# Patient Record
Sex: Male | Born: 1995 | Race: White | Hispanic: No | Marital: Single | Smoking: Current some day smoker
Health system: Southern US, Community
[De-identification: ages and names within clinical notes are randomized; demographics above are authoritative.]

## PROBLEM LIST (undated history)

## (undated) DIAGNOSIS — G4700 Insomnia, unspecified: Secondary | ICD-10-CM

## (undated) DIAGNOSIS — R110 Nausea: Secondary | ICD-10-CM

## (undated) DIAGNOSIS — F419 Anxiety disorder, unspecified: Secondary | ICD-10-CM

---
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
## 2015-11-16 ENCOUNTER — Emergency Department (HOSPITAL_COMMUNITY): Admission: EM | Admit: 2015-11-16 | Discharge: 2015-11-16 | Payer: Self-pay

## 2015-11-16 ENCOUNTER — Encounter (HOSPITAL_COMMUNITY): Payer: Self-pay | Admitting: Emergency Medicine

## 2015-11-16 ENCOUNTER — Inpatient Hospital Stay (HOSPITAL_COMMUNITY)
Admission: AD | Admit: 2015-11-16 | Discharge: 2015-11-19 | DRG: 881 | Disposition: A | Discharge status: Home / Self Care | Payer: Federal, State, Local not specified - Other | Source: Intra-hospital | Attending: Psychiatry | Admitting: Psychiatry

## 2015-11-16 ENCOUNTER — Encounter (HOSPITAL_COMMUNITY): Payer: Self-pay

## 2015-11-16 DIAGNOSIS — Y929 Unspecified place or not applicable: Secondary | ICD-10-CM | POA: Insufficient documentation

## 2015-11-16 DIAGNOSIS — F17210 Nicotine dependence, cigarettes, uncomplicated: Secondary | ICD-10-CM | POA: Insufficient documentation

## 2015-11-16 DIAGNOSIS — Z5181 Encounter for therapeutic drug level monitoring: Secondary | ICD-10-CM | POA: Insufficient documentation

## 2015-11-16 DIAGNOSIS — Z7289 Other problems related to lifestyle: Secondary | ICD-10-CM

## 2015-11-16 DIAGNOSIS — Z720 Tobacco use: Secondary | ICD-10-CM

## 2015-11-16 DIAGNOSIS — S50812A Abrasion of left forearm, initial encounter: Secondary | ICD-10-CM | POA: Insufficient documentation

## 2015-11-16 DIAGNOSIS — G4700 Insomnia, unspecified: Secondary | ICD-10-CM | POA: Diagnosis present

## 2015-11-16 DIAGNOSIS — Y999 Unspecified external cause status: Secondary | ICD-10-CM | POA: Insufficient documentation

## 2015-11-16 DIAGNOSIS — F411 Generalized anxiety disorder: Secondary | ICD-10-CM | POA: Diagnosis present

## 2015-11-16 DIAGNOSIS — R45851 Suicidal ideations: Secondary | ICD-10-CM

## 2015-11-16 DIAGNOSIS — F322 Major depressive disorder, single episode, severe without psychotic features: Secondary | ICD-10-CM | POA: Diagnosis not present

## 2015-11-16 DIAGNOSIS — S40812A Abrasion of left upper arm, initial encounter: Secondary | ICD-10-CM

## 2015-11-16 DIAGNOSIS — X781XXA Intentional self-harm by knife, initial encounter: Secondary | ICD-10-CM | POA: Insufficient documentation

## 2015-11-16 DIAGNOSIS — F329 Major depressive disorder, single episode, unspecified: Secondary | ICD-10-CM | POA: Diagnosis present

## 2015-11-16 DIAGNOSIS — F1290 Cannabis use, unspecified, uncomplicated: Secondary | ICD-10-CM | POA: Insufficient documentation

## 2015-11-16 DIAGNOSIS — Y939 Activity, unspecified: Secondary | ICD-10-CM | POA: Insufficient documentation

## 2015-11-16 LAB — SALICYLATE LEVEL: Salicylate Lvl: <4 mg/dL (ref 2.8–30.0)

## 2015-11-16 LAB — COMPREHENSIVE METABOLIC PANEL
ALK PHOS: 44 U/L (ref 38–126)
ALT: 14 U/L — ABNORMAL LOW (ref 17–63)
AST: 20 U/L (ref 15–41)
Albumin: 4.8 g/dL (ref 3.5–5.0)
Anion gap: 5 (ref 5–15)
BILIRUBIN TOTAL: 0.7 mg/dL (ref 0.3–1.2)
BUN: 14 mg/dL (ref 6–20)
CALCIUM: 9.3 mg/dL (ref 8.9–10.3)
CO2: 28 mmol/L (ref 22–32)
CREATININE: 0.95 mg/dL (ref 0.61–1.24)
Chloride: 106 mmol/L (ref 101–111)
GFR calc Af Amer: >60 mL/min (ref >60)
Glucose, Bld: 99 mg/dL (ref 65–99)
Potassium: 4.4 mmol/L (ref 3.5–5.1)
SODIUM: 139 mmol/L (ref 135–145)
TOTAL PROTEIN: 7.2 g/dL (ref 6.5–8.1)

## 2015-11-16 LAB — CBC
HCT: 44.8 % (ref 39.0–52.0)
Hemoglobin: 15.9 g/dL (ref 13.0–17.0)
MCH: 30.9 pg (ref 26.0–34.0)
MCHC: 35.5 g/dL (ref 30.0–36.0)
MCV: 87.2 fL (ref 78.0–100.0)
PLATELETS: 194 10*3/uL (ref 150–400)
RBC: 5.14 MIL/uL (ref 4.22–5.81)
RDW: 12.3 % (ref 11.5–15.5)
WBC: 6.2 10*3/uL (ref 4.0–10.5)

## 2015-11-16 LAB — RAPID URINE DRUG SCREEN, HOSP PERFORMED
AMPHETAMINES: NOT DETECTED
Barbiturates: NOT DETECTED
Benzodiazepines: NOT DETECTED
Cocaine: NOT DETECTED
Opiates: NOT DETECTED
Tetrahydrocannabinol: POSITIVE — AB

## 2015-11-16 LAB — ETHANOL

## 2015-11-16 LAB — ACETAMINOPHEN LEVEL: Acetaminophen (Tylenol), Serum: <10 ug/mL — ABNORMAL LOW (ref 10–30)

## 2015-11-16 MED ORDER — ALUM & MAG HYDROXIDE-SIMETH 200-200-20 MG/5ML PO SUSP: 30.0000 mL | ORAL | Status: DC | PRN

## 2015-11-16 MED ORDER — ONDANSETRON HCL 4 MG PO TABS: 4.0000 mg | ORAL_TABLET | Freq: Three times a day (TID) | ORAL | Status: DC | PRN

## 2015-11-16 MED ORDER — NICOTINE 21 MG/24HR TD PT24: 21.0000 mg | MEDICATED_PATCH | Freq: Every day | TRANSDERMAL | Status: DC

## 2015-11-16 MED ORDER — ACETAMINOPHEN 325 MG PO TABS: 650.0000 mg | ORAL_TABLET | ORAL | Status: DC | PRN

## 2015-11-16 MED ORDER — LORAZEPAM 1 MG PO TABS: 1.0000 mg | ORAL_TABLET | Freq: Three times a day (TID) | ORAL | Status: DC | PRN

## 2015-11-16 MED ORDER — IBUPROFEN 200 MG PO TABS: 600.0000 mg | ORAL_TABLET | Freq: Three times a day (TID) | ORAL | Status: DC | PRN

## 2015-11-16 MED ORDER — HYDROXYZINE HCL 25 MG PO TABS
25.0000 mg | ORAL_TABLET | Freq: Four times a day (QID) | ORAL | Status: DC | PRN
  Filled 2015-11-16: qty 10

## 2015-11-16 MED ORDER — MAGNESIUM HYDROXIDE 400 MG/5ML PO SUSP: 30.0000 mL | Freq: Every day | ORAL | Status: DC | PRN

## 2015-11-16 MED ORDER — ZOLPIDEM TARTRATE 5 MG PO TABS: 5.0000 mg | ORAL_TABLET | Freq: Every evening | ORAL | Status: DC | PRN

## 2015-11-16 MED ORDER — FLUOXETINE HCL 20 MG PO CAPS
20.0000 mg | ORAL_CAPSULE | Freq: Every day | ORAL | Status: DC
Start: 2015-11-17 — End: 2015-11-19
  Administered 2015-11-17 – 2015-11-19 (×3): 20 mg via ORAL
  Filled 2015-11-16: qty 1
  Filled 2015-11-16: qty 7
  Filled 2015-11-16 (×3): qty 1

## 2015-11-16 MED ORDER — TRAZODONE HCL 50 MG PO TABS
50.0000 mg | ORAL_TABLET | Freq: Every evening | ORAL | Status: DC | PRN
  Filled 2015-11-16 (×4): qty 1
  Filled 2015-11-16: qty 14
  Filled 2015-11-16 (×3): qty 1
  Filled 2015-11-16: qty 14

## 2015-11-16 MED ORDER — ACETAMINOPHEN 325 MG PO TABS: 650.0000 mg | ORAL_TABLET | Freq: Four times a day (QID) | ORAL | Status: DC | PRN

## 2015-11-16 NOTE — BH Assessment (Signed)
[facility] Assessment Progress Note  [facility]45

## 2015-11-16 NOTE — ED Triage Notes (Signed)
Pt expresses SI. Pt cut L forearm this am. L arm abraded. Denies HI or substance abuse. Has been experiencing stressors at home.

## 2015-11-16 NOTE — BH Assessment (Addendum)
Assessment Note  Martin Greene is an 20 y.o. male that presents this date by EMS having S/I with intent and plan. Patient is very tearful on presentation and reports that he has had ongoing thoughts to harm himself over the last several months. Patient has several lacerations to the left forearm that he admits to self inflicting this date. Patient resides with family and reports ongoing depression that has worsened over the last few months. Patient denies any current SA use, H/I or AVH. Patient reports that he has never sought any treatment for his MH issues and denies any previous inpatient/outpatient treatment. Patient found it difficult to verbalize his feelings associated with his depression and thoughts of self harm. Patient started to become very emotional as the assessment progressed and at one point could not respond to this writer's questions. Patient stated that he has been having additional stress the last few months associated with lack of  employment, relationship issues with family (brother) and reports becoming increasingly isolated rarely coming out of his room. Patient is time/place oriented and denies any current legal. Patient stated he thought "today was the day to do it" referring to taking his life and admitted to this writer that his depression was very overwhelming reporting his depression to be at a 10 "every day." Patient stated that his brother "pushes his buttons" and today made remarks that trigger the attempt to harm himself. Patient denies any self injurious behaviors prior to this date but has "thought about it often."  Patient stated he is open to a voluntary admission and admits to needing help. Patient stated "I can't go on like this." Patient requested this writer to contact his mother Martin Greene [phone] to inform her that patient was going to be admitted. Patient's mother informed this Clinical research associatewriter that she felt patient had been suffering from depression and PTSD since early  childhood. Patient's mother reported patient had a "bad relationship with his father" but was never diagnosed. Case was staffed with Martin PollackLord DNP who recommended an inpatient admission as appropriate bed placement is investigated.        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 No pertinent past medical history.  History reviewed. No pertinent surgical history.  Family History: History reviewed. No pertinent family history.  Social History:  reports that he has never smoked. He has never used smokeless tobacco. He reports that he does not drink alcohol. His drug history is not on file.  Additional Social History:  Alcohol / Drug Use Pain Medications: See MAR Prescriptions: See MAR Over the Counter: See MAR History of alcohol / drug use?: No history of alcohol / drug abuse  CIWA: CIWA-Ar BP: 132/88 Pulse Rate: 92 COWS:    Allergies: No Known Allergies  Home Medications:  (Not in a hospital admission)  OB/GYN Status:  No LMP for male patient.  General Assessment Data Location of Assessment: [facility] TTS Assessment: In system Is this a Tele or Face-to-Face Assessment?: Face-to-Face Is this an Initial Assessment or a Re-assessment for this encounter?: Initial Assessment Marital status: Single Maiden name: na Is patient pregnant?: No Pregnancy Status: No Living Arrangements: Parent Can pt return to current living arrangement?: Yes Admission Status: Voluntary Is patient capable of signing voluntary admission?: Yes Referral Source: Self/Family/Friend Insurance type: none  Medical Screening Exam [facility] Walk-[facility] ONLY) Medical Exam completed: Yes Reason for MSE not completed: Other:  Crisis Care Plan Living Arrangements: Parent Legal Guardian: Other: (na) Name  of Psychiatrist: None Name of Therapist: None  Education Status Is patient currently in school?: No Current Grade: na Highest grade of school  patient has completed: 57 Name of school: na Contact person: na  Risk to self with the past 6 months Suicidal Ideation: Yes-Currently Present Has patient been a risk to self within the past 6 months prior to admission? : Yes Suicidal Intent: Yes-Currently Present Has patient had any suicidal intent within the past 6 months prior to admission? : Yes Is patient at risk for suicide?: Yes Suicidal Plan?: Yes-Currently Present Has patient had any suicidal plan within the past 6 months prior to admission? : No Specify Current Suicidal Plan: pt attempted to cut wrist this date Access to Means: Yes Specify Access to Suicidal Means: pt cut left forearm What has been your use of drugs/alcohol within the last 12 months?: Denies Previous Attempts/Gestures: No How many times?: 0 Other Self Harm Risks: none Triggers for Past Attempts: Unknown Intentional Self Injurious Behavior: Cutting Comment - Self Injurious Behavior: pt cut himself this date Family Suicide History: No Recent stressful life event(s): Other (Comment) (employment issues, relationship issues) Persecutory voices/beliefs?: No Depression: Yes Depression Symptoms: Loss of interest in usual pleasures, Feeling worthless/self pity Substance abuse history and/or treatment for substance abuse?: No Suicide prevention information given to non-admitted patients: Not applicable  Risk to Others within the past 6 months Homicidal Ideation: No Does patient have any lifetime risk of violence toward others beyond the six months prior to admission? : No Thoughts of Harm to Others: No Current Homicidal Intent: No Current Homicidal Plan: No Access to Homicidal Means: No Identified Victim: na History of harm to others?: No Assessment of Violence: None Noted Violent Behavior Description: na Does patient have access to weapons?: No Criminal Charges Pending?: No Does patient have a court date: No Is patient on probation?:  No  Psychosis Hallucinations: None noted Delusions: None noted  Mental Status Report Appearance/Hygiene: In scrubs Eye Contact: Fair Motor Activity: Freedom of movement Speech: Logical/coherent Level of Consciousness: Alert Mood: Depressed Affect: Depressed Anxiety Level: Moderate Thought Processes: Coherent, Relevant Judgement: Unimpaired Orientation: Person, Place, Time Obsessive Compulsive Thoughts/Behaviors: None  Cognitive Functioning Concentration: Normal Memory: Recent Intact IQ: Average Insight: Fair Impulse Control: Poor Appetite: Fair Weight Loss: 0 Weight Gain: 0 Sleep: Decreased Total Hours of Sleep: 5 Vegetative Symptoms: None  ADLScreening [facility] Assessment Services) Patient's cognitive ability adequate to safely complete daily activities?: Yes Patient able to express need for assistance with ADLs?: Yes Independently performs ADLs?: Yes (appropriate for developmental age)  Prior Inpatient Therapy Prior Inpatient Therapy: No Prior Therapy Dates: na Prior Therapy Facilty/Provider(s): na Reason for Treatment: na  Prior Outpatient Therapy Prior Outpatient Therapy: No Prior Therapy Dates: none Prior Therapy Facilty/Provider(s): none Reason for Treatment: na Does patient have an ACCT team?: No Does patient have Intensive In-House Services?  : No Does patient have [facility] services? : No Does patient have P4CC services?: No  ADL Screening (condition at time of admission) Patient's cognitive ability adequate to safely complete daily activities?: Yes Is the patient deaf or have difficulty hearing?: No Does the patient have difficulty seeing, even when wearing glasses/contacts?: No Does the patient have difficulty concentrating, remembering, or making decisions?: No Patient able to express need for assistance with ADLs?: Yes Does the patient have difficulty dressing or bathing?: No Independently performs ADLs?: Yes (appropriate for developmental  age) Does the patient have difficulty walking or climbing stairs?: No Weakness of Legs: None Weakness of Arms/Hands:  None  Home Assistive Devices/Equipment Home Assistive Devices/Equipment: None  Therapy Consults (therapy consults require a physician order) PT Evaluation Needed: No OT Evalulation Needed: No SLP Evaluation Needed: No Abuse/Neglect Assessment (Assessment to be complete while patient is alone) Physical Abuse: Denies Verbal Abuse: Denies Sexual Abuse: Denies Exploitation of patient/patient's resources: Denies Self-Neglect: Denies Values / Beliefs Cultural Requests During Hospitalization: None Spiritual Requests During Hospitalization: None Consults Spiritual Care Consult Needed: No Advance Directives (For Healthcare) Does patient have an advance directive?: No Would patient like information on creating an advanced directive?: No - patient declined information (pt declined)    Additional Information 1:1 In Past 12 Months?: No CIRT Risk: No Elopement Risk: No Does patient have medical clearance?: Yes     Disposition: Case was staffed with Martin Pollack DNP who recommened an inpatient admission as appropriate bed placement is investigated.        Disposition Initial Assessment Completed for this Encounter: Yes Disposition of Patient: Inpatient treatment program Type of inpatient treatment program: Adult  On Site Evaluation by:   Reviewed with Physician:    Alfredia Ferguson 11/16/2015 5:05 PM

## 2015-11-16 NOTE — ED Notes (Signed)
Attempted to orient patient to unit but he is very irritable over wanting a cigarette.  He does not want to discuss anything but how "Francesca OmanYall are going to figure out to get me a hit from a cigarette."  His mother arrived to visit and I will try to assess him at a later time.  15 minute checks and video monitoring in place.

## 2015-11-16 NOTE — ED Notes (Signed)
[facility]Michael[facility].  Pending [facility] transport.

## 2015-11-16 NOTE — ED Provider Notes (Signed)
[facility]-EMERGENCY DEPT Provider Note   CSN: [id number] Arrival date & time: 11/16/15  1518  First Provider Contact:  First MD Initiated Contact with Patient 11/16/15 1609        History   Chief Complaint Chief Complaint  Patient presents with  . Suicidal    HPI Martin Greene is a 20 y.o. Otherwise healthy male, who presents to the ED via [employer] with complaints of suicidal ideations "for years" without a specific plan. Patient states that this morning he used a box cutter to cut his left forearm, and his brother called the cops because he was concerned about patient's mental state due to his suicidal ideations. Patient denies that he was cutting his arm in an attempt to commit suicide. States that he is experiencing multiple stressors at home but has worsened his suicidal thoughts. Denies HI or AVH, illicit drug use, or alcohol use. Positive smoker. He has no medical problems and takes no medicines. He denies any medical complaints at this time, aside from the abrasions to the L forearm. Denies pain, swelling, numbness, tingling, weakness, or any other complaints. He is here voluntarily at this time, no IVC paperwork was taken out prior to his arrival.   The history is provided by the patient and the police. No language interpreter was used.    History reviewed. No pertinent past medical history.  There are no active problems to display for this patient.   History reviewed. No pertinent surgical history.     Home Medications    Prior to Admission medications   Not on File    Family History History reviewed. No pertinent family history.  Social History Social History  Substance Use Topics  . Smoking status: Never Smoker  . Smokeless tobacco: Never Used  . Alcohol use No     Allergies   Review of patient's allergies indicates no known allergies.   Review of Systems Review of Systems  Constitutional: Negative for chills and fever.  Respiratory: Negative for shortness of  breath.   Cardiovascular: Negative for chest pain.  Gastrointestinal: Negative for abdominal pain, constipation, diarrhea, nausea and vomiting.  Genitourinary: Negative for dysuria and hematuria.  Musculoskeletal: Negative for arthralgias and myalgias.  Skin: Positive for wound. Negative for color change.  Allergic/Immunologic: Negative for immunocompromised state.  Neurological: Negative for weakness and numbness.  Psychiatric/Behavioral: Positive for self-injury and suicidal ideas. Negative for confusion and hallucinations.   10 Systems reviewed and are negative for acute change except as noted in the HPI.   Physical Exam Updated Vital Signs BP 132/88 (BP Location: Right Arm)   Pulse 92   Temp 97.9 F (36.6 C) (Oral)   Resp 18   SpO2 98%   Physical Exam  Constitutional: He is oriented to person, place, and time. Vital signs are normal. He appears well-developed and well-nourished.  Non-toxic appearance. No distress.  Afebrile, nontoxic, NAD  HENT:  Head: Normocephalic and atraumatic.  Mouth/Throat: Oropharynx is clear and moist and mucous membranes are normal.  Eyes: Conjunctivae and EOM are normal. Right eye exhibits no discharge. Left eye exhibits no discharge.  Neck: Normal range of motion. Neck supple.  Cardiovascular: Normal rate, regular rhythm, normal heart sounds and intact distal pulses.  Exam reveals no gallop and no friction rub.   No murmur heard. Pulmonary/Chest: Effort normal and breath sounds normal. No respiratory distress. He has no decreased breath sounds. He has no wheezes. He has no rhonchi. He has no rales.  Abdominal: Soft. Normal appearance and  bowel sounds are normal. He exhibits no distension. There is no tenderness. There is no rigidity, no rebound, no guarding, no CVA tenderness, no tenderness at McBurney's point and negative Murphy's sign.  Musculoskeletal: Normal range of motion.  L forearm without swelling, FROM intact at wrist and elbow, no focal  bony TTP, with multiple superficial abrasions to volar aspect of the forearm, no ongoing bleeding, no evidence of infection.  MAE x4 Strength and sensation grossly intact in all extremities.  Distal pulses intact Gait steady  Neurological: He is alert and oriented to person, place, and time. He has normal strength. No sensory deficit.  Skin: Skin is warm and dry. Abrasion noted. No rash noted.  L forearm abrasion as noted above  Psychiatric: His affect is blunt. He is not actively hallucinating. He exhibits a depressed mood. He expresses suicidal ideation. He expresses no homicidal ideation. He expresses no suicidal plans and no homicidal plans.  Flat and depressed affect. Endorsing SI without a plan. Denies HI/AVH  Nursing note and vitals reviewed.    ED Treatments / Results  Labs (all labs ordered are listed, but only abnormal results are displayed) Labs Reviewed  COMPREHENSIVE METABOLIC PANEL - Abnormal; Notable for the following:       Result Value   ALT 14 (*)    All other components within normal limits  ACETAMINOPHEN LEVEL - Abnormal; Notable for the following:    Acetaminophen (Tylenol), Serum <10 (*)    All other components within normal limits  URINE RAPID DRUG SCREEN, HOSP PERFORMED - Abnormal; Notable for the following:    Tetrahydrocannabinol POSITIVE (*)    All other components within normal limits  ETHANOL  SALICYLATE LEVEL  CBC    EKG  EKG Interpretation None       Radiology No results found.  Procedures Procedures (including critical care time)  Medications Ordered in ED Medications  alum & mag hydroxide-simeth (MAALOX/MYLANTA) 200-200-20 MG/5ML suspension 30 mL (not administered)  ondansetron (ZOFRAN) tablet 4 mg (not administered)  nicotine (NICODERM CQ - dosed in mg/24 hours) patch 21 mg (not administered)  zolpidem (AMBIEN) tablet 5 mg (not administered)  ibuprofen (ADVIL,MOTRIN) tablet 600 mg (not administered)  acetaminophen (TYLENOL) tablet  650 mg (not administered)  LORazepam (ATIVAN) tablet 1 mg (not administered)     Initial Impression / Assessment and Plan / ED Course  I have reviewed the triage vital signs and the nursing notes.  Pertinent labs & imaging results that were available during my care of the patient were reviewed by me and considered in my medical decision making (see chart for details).  Clinical Course    20 y.o. male here with SI and self inflicted abrasions to L forearm. Reports stress at home. Brother called the cops on him because they're worried about his safety. He is here voluntarily at this point, and agrees to stay voluntarily for help with his SI. Denies HI/AVH or any other complaints. Abrasions to L forearm are superficial, don't need any intervention. No evidence of infection. Will apply dressings. Will get clearance labs and TTS consult. Psych hold orders placed. Will reassess after clearance labs.   5:48 PM  CBC and CMP WNL. EtOH undetectable. Salicylate and tylenol WNL. UDS with +THC but otherwise unremarkable. TTS reports he meets IP criteria and is voluntarily staying for such. Please see their notes for further documentation of care/dispo. Pt medically cleared and stable at this time. Holding orders in place.   Final Clinical Impressions(s) / ED Diagnoses  Final diagnoses:  Suicidal ideation  Arm abrasion, left, initial encounter  Deliberate self-cutting  Tobacco use  Marijuana use    New Prescriptions New Prescriptions   No medications on file     Allen Derry, PA-C 11/16/15 1748    Lorre Nick, MD 11/20/15 1455

## 2015-11-16 NOTE — ED Notes (Signed)
Pt A&O x 3, no distress noted, calm & cooperative at present.  Denies SI, Superficial abrasions noted to L arm, no bleeding noted.  Monitoring for safety, Q 15 min checks in effect.

## 2015-11-17 DIAGNOSIS — F322 Major depressive disorder, single episode, severe without psychotic features: Secondary | ICD-10-CM

## 2015-11-17 NOTE — Progress Notes (Signed)
D    Pt is calm and cooperative   He attends and participates in groups   He declined a sleep medication saying he sleeps ok without a sleep aid   Pt wants to be discharged and reports he doesn't need to be here   He said his brother pushes his buttons and makes him do things he would not normally do  A   Verbal support given   Medications offered   Q 15 min checks R   Pt remains safe and was receptive to verbal support

## 2015-11-17 NOTE — H&P (Signed)
Psychiatric Admission Assessment Adult  Patient Identification: Per Beagley MRN:  [id number] Date of Evaluation:  11/17/2015 Chief Complaint:  " I felt hurt by my brother "  Principal Diagnosis: suicidal ideations  Diagnosis:   Patient Active Problem List   Diagnosis Date Noted  . MDD (major depressive disorder) ([facility]) [F32.9] 11/16/2015   History of Present Illness: 20 year old man, reports he has history of depression, with  a tendency to sadness and negative thoughts. He states, however, that he was doing relatively OK recently, and not feeling particularly depressed. Does not endorse recent neuro-vegetative symptoms of depression . On day of admission  states that he had an argument with his brother yesterday  , during which " he started saying all this hurtful stuff to me, that he would wish I would die on my birthday which is coming up ". States after this argument he felt intensely depressed, angry, and cut self with box cutter on forearm,- has several superficial cuts on forearm . Did not need sutures.  States " I knew I was not going to die, I just needed to do something because I felt my head was going to explode ". States that family called 31 was brought to the hospital .   Associated Signs/Symptoms: Depression Symptoms:  Of note, patient denies any major neuro-vegetative symptoms at this time, minimizes anhedonia, denies sleep, appetite, energy level changes  (Hypo) Manic Symptoms: denies  Anxiety Symptoms:  Has had panic attacks , denies agoraphobia .  Psychotic Symptoms:  Denies  PTSD Symptoms: Does not endorse  Total Time spent with patient: 45 minutes  Past Psychiatric History:  No prior psychiatric admissions, denies prior history of self cutting or self injurious behaviors, denies history of violence, denies history of mania , denies history of psychosis, denies history of PTSD . States he has been diagnosed with ADHD in the past .  Is the patient at risk to self? Yes.     Has the patient been a risk to self in the past 6 months? No.  Has the patient been a risk to self within the distant past? No.  Is the patient a risk to others? No.  Has the patient been a risk to others in the past 6 months? No.  Has the patient been a risk to others within the distant past? No.   Prior Inpatient Therapy:  denies  Prior Outpatient Therapy:  denies   Alcohol Screening: 1. How often do you have a drink containing alcohol?: Monthly or less 2. How many drinks containing alcohol do you have on a typical day when you are drinking?: 7, 8, or 9 3. How often do you have six or more drinks on one occasion?: Less than monthly Preliminary Score: 4 4. How often during the last year have you found that you were not able to stop drinking once you had started?: Never 5. How often during the last year have you failed to do what was normally expected from you becasue of drinking?: Never 6. How often during the last year have you needed a first drink in the morning to get yourself going after a heavy drinking session?: Never 7. How often during the last year have you had a feeling of guilt of remorse after drinking?: Never 8. How often during the last year have you been unable to remember what happened the night before because you had been drinking?: Never 9. Have you or someone else been injured as a result of your  drinking?: No 10. Has a relative or friend or a doctor or another health worker been concerned about your drinking or suggested you cut down?: No Alcohol Use Disorder Identification Test Final Score (AUDIT): 5 Brief Intervention: AUDIT score less than 7 or less-screening does not suggest unhealthy drinking-brief intervention not indicated (Pt stated he only drinks on his birthday) Substance Abuse History in the last 12 months:   Denies alcohol or drug abuse . States smokes cannabis occasionally  Consequences of Substance Abuse: Denies  Previous Psychotropic Medications: no   Psychological Evaluations: no  Past Medical History: History reviewed. No pertinent past medical history. History reviewed. No pertinent surgical history. Family History:  Parents alive, separated,  Has one brother , one sister- estranged from father . Family Psychiatric  History: states mother and grandmother have history of depression , no history of suicides in family, no history of substance abuse in family  Tobacco Screening: Have you used any form of tobacco in the last 30 days? 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 History  Alcohol Use No     History  Drug Use  . Types: Marijuana    Additional Social History: Marital status: Single Does patient have children?: No  Allergies:  No Known Allergies Lab Results:  Results for orders placed or performed during the hospital encounter of 11/16/15 (from the past 48 hour(s))  Ethanol     Status: None   Collection Time: 11/16/15  4:07 PM  Result Value Ref Range   Alcohol, Ethyl (B) <5 <5 mg/dL    Comment:        LOWEST DETECTABLE LIMIT FOR SERUM ALCOHOL IS 5 mg/dL FOR MEDICAL PURPOSES ONLY   Salicylate level     Status: None   Collection Time: 11/16/15  4:07 PM  Result Value Ref Range   Salicylate Lvl <7.0 2.8 - 30.0 mg/dL  Acetaminophen level     Status: Abnormal   Collection Time: 11/16/15  4:07 PM  Result Value Ref Range   Acetaminophen (Tylenol), Serum <10 (L) 10 - 30 ug/mL    Comment:        THERAPEUTIC CONCENTRATIONS VARY SIGNIFICANTLY. A RANGE  OF 10-30 ug/mL MAY BE AN EFFECTIVE CONCENTRATION FOR MANY PATIENTS. HOWEVER, SOME ARE BEST TREATED AT CONCENTRATIONS OUTSIDE THIS RANGE. ACETAMINOPHEN CONCENTRATIONS >150 ug/mL AT 4 HOURS AFTER INGESTION AND >50 ug/mL AT 12 HOURS AFTER INGESTION ARE OFTEN ASSOCIATED WITH TOXIC REACTIONS.   Comprehensive metabolic panel     Status: Abnormal   Collection Time: 11/16/15  4:45 PM  Result Value Ref Range   Sodium 139 135 - 145 mmol/L   Potassium 4.4 3.5 - 5.1 mmol/L   Chloride 106 101 - 111 mmol/L   CO2 28 22 - 32 mmol/L   Glucose, Bld 99 65 - 99 mg/dL   BUN 14 6 - 20 mg/dL   Creatinine, Ser 0.95 0.61 - 1.24 mg/dL   Calcium 9.3 8.9 - 10.3 mg/dL   Total Protein 7.2 6.5 - 8.1 g/dL   Albumin 4.8 3.5 - 5.0 g/dL   AST 20 15 - 41 U/L   ALT 14 (  L) 17 - 63 U/L   Alkaline Phosphatase 44 38 - 126 U/L   Total Bilirubin 0.7 0.3 - 1.2 mg/dL   GFR calc non Af Amer >60 >60 mL/min   GFR calc Af Amer >60 >60 mL/min    Comment: (NOTE) The eGFR has been calculated using the CKD EPI equation. This calculation has not been validated in all clinical situations. eGFR's persistently <60 mL/min signify possible Chronic Kidney Disease.    Anion gap 5 5 - 15  cbc     Status: None   Collection Time: 11/16/15  4:45 PM  Result Value Ref Range   WBC 6.2 4.0 - 10.5 K/uL   RBC 5.14 4.22 - 5.81 MIL/uL   Hemoglobin 15.9 13.0 - 17.0 g/dL   HCT 44.8 39.0 - 52.0 %   MCV 87.2 78.0 - 100.0 fL   MCH 30.9 26.0 - 34.0 pg   MCHC 35.5 30.0 - 36.0 g/dL   RDW 12.3 11.5 - 15.5 %   Platelets 194 150 - 400 K/uL  Rapid urine drug screen (hospital performed)     Status: Abnormal   Collection Time: 11/16/15  4:45 PM  Result Value Ref Range   Opiates NONE DETECTED NONE DETECTED   Cocaine NONE DETECTED NONE DETECTED   Benzodiazepines NONE DETECTED NONE DETECTED   Amphetamines NONE DETECTED NONE DETECTED   Tetrahydrocannabinol POSITIVE (A) NONE DETECTED   Barbiturates NONE DETECTED NONE DETECTED    Comment:         DRUG SCREEN FOR MEDICAL PURPOSES ONLY.  IF CONFIRMATION IS NEEDED FOR ANY PURPOSE, NOTIFY LAB WITHIN 5 DAYS.        LOWEST DETECTABLE LIMITS FOR URINE DRUG SCREEN Drug Class       Cutoff (ng/mL) Amphetamine      1000 Barbiturate      200 Benzodiazepine   561 Tricyclics       537 Opiates          300 Cocaine          300 THC              50     Blood Alcohol level:  Lab Results  Component Value Date   ETH <5 94/32/7614    Metabolic Disorder Labs:  No results found for: HGBA1C, MPG No results found for: PROLACTIN No results found for: CHOL, TRIG, HDL, CHOLHDL, VLDL, LDLCALC  Current Medications: Current Facility-Administered Medications  Medication Dose Route Frequency Provider Last Rate Last Dose  . acetaminophen (TYLENOL) tablet 650 mg  650 mg Oral Q6H PRN Laverle Hobby, PA-C      . alum & mag hydroxide-simeth (MAALOX/MYLANTA) 200-200-20 MG/5ML suspension 30 mL  30 mL Oral Q4H PRN Laverle Hobby, PA-C      . FLUoxetine (PROZAC) capsule 20 mg  20 mg Oral Daily Laverle Hobby, PA-C   20 mg at 11/17/15 7092  . hydrOXYzine (ATARAX/VISTARIL) tablet 25 mg  25 mg Oral Q6H PRN Laverle Hobby, PA-C      . magnesium hydroxide (MILK OF MAGNESIA) suspension 30 mL  30 mL Oral Daily PRN Laverle Hobby, PA-C      . traZODone (DESYREL) tablet 50 mg  50 mg Oral QHS,MR X 1 Spencer E Simon, PA-C       PTA Medications: No prescriptions prior to admission.    Musculoskeletal: Strength & Muscle Tone: within normal limits Gait & Station: normal Patient leans: N/A  Psychiatric Specialty Exam: Physical Exam  Review of  Systems  Constitutional: Negative.   HENT: Negative.   Eyes: Negative.   Respiratory: Negative.   Cardiovascular: Negative.   Gastrointestinal: Negative.   Genitourinary: Negative.   Musculoskeletal: Negative.   Skin: Negative.   Neurological: Negative for seizures.  Psychiatric/Behavioral: Positive for depression and suicidal ideas.  All other systems  reviewed and are negative.   Blood pressure 138/66, pulse (!) 45, temperature 97.6 F (36.4 C), temperature source Oral, resp. rate 16, height _0  (1.651 m), weight 149 lb (67.6 kg).Body mass index is 24.79 kg/m.  General Appearance: Fairly Groomed  Eye Contact:  Good  Speech:  Normal Rate  Volume:  Normal  Mood:  depressed, but feels improved   Affect:  Appropriate and reactive, smiles at times appropriately   Thought Process:  Linear  Orientation:  Full (Time, Place, and Person)  Thought Content:  no hallucinations, no delusions, not internally preoccupied   Suicidal Thoughts:  No- denies any suicidal or self injurious ideations, and contracts for safety on the unit   Homicidal Thoughts:  No- denies any violent or homicidal ideations, specifically also denies any thoughts of violence towards brother   Memory:  recent and remote grossly intact   Judgement:  Fair  Insight:  Fair  Psychomotor Activity:  Normal  Concentration:  Concentration: Good and Attention Span: Good  Recall:  Good  Fund of Knowledge:  Good  Language:  Good  Akathisia:  Negative  Handed:  Right  AIMS (if indicated):     Assets:  Desire for Improvement Resilience  ADL's:  Intact  Cognition:  WNL  Sleep:  Number of Hours: 4.75       Treatment Plan Summary: Daily contact with patient to assess and evaluate symptoms and progress in treatment, Medication management, Plan inpatient admission  and medications as below   Observation Level/Precautions:  15 minute checks  Laboratory:  as needed   Psychotherapy:  Milieu, support, groups   Medications: has been started on Prozac, agrees to continue this medication, denies side effects, we discussed side effects/risk of increased suicidal ideations early in treatment with antidepressants in young adults  On Vistaril 25 mgr Q 6 hours PRN for anxiety   Consultations:  As needed   Discharge Concerns:  -   Estimated LOS: 4 days   Other:     I certify that  inpatient services furnished can reasonably be expected to improve the patient's condition.    Neita Garnet, MD 8/8/20174:29 PM

## 2015-11-17 NOTE — Progress Notes (Signed)
Patient ID: Ignacia BayleyMicah Greene, male   DOB: 11-22-1995, 20 y.o.   MRN: [id number] D: Patient presents with neutral mood.  He denies any depressive symptoms or anxiety.  Patient did not attend groups this morning.  He denies any thoughts of self harm.  Patient appears minimal with staff and vague about his symptoms.  Patient rates all his symptoms as a 0.  He is sleeping well; appetite is fair; energy is normal and concentration is good.   A: Continue to monitor medication management and MD orders.  Safety checks completed every 15 minutes per protocol.  Offer support and encouragement as needed. R: Patient remains isolative.

## 2015-11-17 NOTE — Tx Team (Signed)
Initial Interdisciplinary Treatment Plan   PATIENT STRESSORS: Financial difficulties Occupational concerns Substance abuse   PATIENT STRENGTHS: Wellsite geologistCommunication skills General fund of knowledge Physical Health Supportive family/friends   PROBLEM LIST: Problem List/Patient Goals Date to be addressed Date deferred Reason deferred Estimated date of resolution  Risk for suicide 11/16/15     Depression 11/16/15     "nothing" 11/16/15                                          DISCHARGE CRITERIA:  Adequate post-discharge living arrangements Improved stabilization in mood, thinking, and/or behavior Verbal commitment to aftercare and medication compliance  PRELIMINARY DISCHARGE PLAN: Attend PHP/IOP  PATIENT/FAMIILY INVOLVEMENT: This treatment plan has been presented to and reviewed with the patient, Martin BayleyMicah Greene.  The patient and family have been given the opportunity to ask questions and make suggestions.  Jacques Navyhillips, Almir Botts A 11/17/2015, 1:49 AM
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
## 2015-11-17 NOTE — Progress Notes (Signed)
Patient ID: Ignacia BayleyMicah Mcgilvery, male   DOB: 08/07/1995, 20 y.o.   MRN: [id number]  Admission Note:  D:19 yr male who presents VC in no acute distress for the treatment of SI and Depression. Pt appears flat and depressed. Pt was calm and cooperative with admission process Pt denies SI/HI/AVH at this time . Pt stated he was just depressed. Pt presented with a matter of fact attitude. Pt appeared to be a poor historian, pt denied using any drugs but when told his urine tested +ve for THC then he said he smoked. Pt laughing inappropriately at times and not appearing to take his situation seriously.   A:Skin was assessed and found to be clear of any abnormal marks apart from old scars on thighs, Superficial cuts L-forearm . PT searched and no contraband found, POC and unit policies explained and understanding verbalized. Consents obtained. Food and fluids offered, and fluids accepted.   R:Pt had no additional questions or concerns.

## 2015-11-17 NOTE — Tx Team (Signed)
Interdisciplinary Treatment Plan Update (Adult) Date: 11/17/2015   Date: 11/17/2015 9:20 AM  Progress in Treatment:  Attending groups: Martin Greene is new to milieu, continuing to assess  Participating in groups: Martin Greene is new to milieu, continuing to assess  Taking medication as prescribed: Yes  Tolerating medication: Yes  Family/Significant othe contact made: No, CSW attempting to contact mother Patient understands diagnosis: Continuing to assess Discussing patient identified problems/goals with staff: Yes  Medical problems stabilized or resolved: Yes  Denies suicidal/homicidal ideation: Yes Patient has not harmed self or Others: Yes   New problem(s) identified: None identified at this time.   Discharge Plan or Barriers: Martin Greene will return home and follow-up with outpatient services.   Additional comments:  Patient and CSW reviewed Martin Greene's identified goals and treatment plan. Patient verbalized understanding and agreed to treatment plan.   Reason for Continuation of Hospitalization:  Depression Medication stabilization Suicidal ideation  Estimated length of stay: 2-3 days  Review of initial/current patient goals per problem list:   1.  Goal(s): Patient will participate in aftercare plan  Met:  Yes  Target date: 3-5 days from date of admission   As evidenced by: Patient will participate within aftercare plan AEB aftercare provider and housing plan at discharge being identified.   11/17/15: Martin Greene will return home and follow-up with outpatient services.   2.  Goal (s): Patient will exhibit decreased depressive symptoms and suicidal ideations.  Met:  Yes  Target date: 3-5 days from date of admission   As evidenced by: Patient will utilize self rating of depression at 3 or below and demonstrate decreased signs of depression or be deemed stable for discharge by MD.  11/17/15: Martin Greene reports no depression today; denies SI   Attendees:  Patient:    Family:    Physician: Dr. Parke Poisson, MD  11/17/2015 9:20 AM   Nursing: Mayra Neer, RN 11/17/2015 9:20 AM  Clinical Social Worker Peri Maris, [place] 11/17/2015 9:20 AM  Other: Erasmo Downer Drinkard, [place] 11/17/2015 9:20 AM  Clinical:  11/17/2015 9:20 AM  Other:  11/17/2015 9:20 AM  Other:     Peri Maris, [place] Work [phone]

## 2015-11-17 NOTE — [facility] Group Notes (Signed)
[facility] Group Notes:  (Nursing/MHT/Case Management/Adjunct)  Date:  11/17/2015  Time:  9:02 AM  Type of Therapy:  Psychoeducational Skills  Participation Level:  Did Not Attend  Patient invited; declined to attend.  Cranford MonBeaudry, Jobanny Mavis Evans 11/17/2015, 9:02 AM
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
## 2015-11-17 NOTE — [facility] Group Notes (Signed)
[facility] LCSW Group Therapy 11/17/2015 1:15 PM  Type of Therapy: Group Therapy- Feelings about Diagnosis  Pt did not attend, declined invitation.   Chad CordialLauren Carter, LCSWA 11/17/2015 3:03 PM

## 2015-11-17 NOTE — Progress Notes (Signed)
Adult Psychoeducational Group Note  Date:  11/17/2015 Time:  8:45  Group Topic/Focus:  Wrap-Up Group:   The focus of this group is to help patients review their daily goal of treatment and discuss progress on daily workbooks.   Participation Level:  Active  Participation Quality:  Appropriate  Affect:  Appropriate  Cognitive:  Appropriate  Insight: Appropriate  Engagement in Group:  Engaged  Modes of Intervention:  Discussion  Additional Comments:  Pt rated his overall day a 10 out of 10 because "I'm alive". Pt reported that he achieve his goal for the day, but did not go into detail as to what his goal was.    Cleotilde NeerJasmine S Yazen Rosko 11/17/2015, 9:52 PM

## 2015-11-17 NOTE — Progress Notes (Signed)
Recreation Therapy Notes   Animal-Assisted Activity (AAA) Program Checklist/Progress Notes Patient Eligibility Criteria Checklist & Daily Group note for Rec TxIntervention  Date: 08.08.2017 Time: 2:45pm Location: [address] Dayroom    AAA/T Program Assumption of Risk Form signed by Patient/ or Parent Legal Guardian Yes  Patient is free of allergies or sever asthma Yes  Patient reports no fear of animals Yes  Patient reports no history of cruelty to animals Yes  Patient understands his/her participation is voluntary Yes  Behavioral Response: Did not attend.   Aroush Chasse L Lashanta Elbe, LRT/CTRS  Jalin Alicea L 11/17/2015 3:11 PM 

## 2015-11-18 NOTE — Progress Notes (Signed)
D: Pt presents with flat affect. Pt appears to be minimizing his symptoms of depression this morning. Pt denies depression and suicidal thoughts. Pt states that he was admitted due to family issues and have to return back home because he's the only one able to work in his home. Pt compliant with tx. No side effects to meds verbalized by pt. A: Medications administered as ordered per MD. Verbal support provided. Pt encouraged to attend groups. 15 minute checks performed for safety.  R: Pt stated goal is to smile more today. Pt verbalizes understanding of med regimen.
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
## 2015-11-18 NOTE — Progress Notes (Signed)
D    Pt is calm and cooperative   He attends and participates in groups   He declined a sleep medication saying he sleeps ok without a sleep aid   Pt reports he is looking forward to being discharged tomorrow A   Verbal support given   Medications offered   Q 15 min checks R   Pt remains safe and was receptive to verbal support

## 2015-11-18 NOTE — Plan of Care (Signed)
Problem: Health Behavior/Discharge Planning: Goal: Compliance with treatment plan for underlying cause of condition will improve Outcome: Progressing Pt compliant with taking antidepressant as scheduled.

## 2015-11-18 NOTE — [facility] Group Notes (Signed)
[facility]  11/18/2015 8:45 AM  Participation Quality: Alert, Appropriate and Oriented  Mood/Affect: Appropriate  Depression Rating: 0  Anxiety Rating: 0  Thoughts of Suicide: Pt denies SI/HI  Will you contract for safety? Yes  Current AVH: Pt denies  Plan for Discharge/Comments: Pt attended discharge planning group and actively participated in group. CSW discussed suicide prevention education with the group and encouraged them to discuss discharge planning and any relevant barriers. Pt denies depression and anxiety today and is hopeful to discharge soon.   Transportation Means: Pt reports access to transportation  Supports: No supports mentioned at this time  Chad CordialLauren Carter, LCSWA 11/18/2015 3:29 PM

## 2015-11-18 NOTE — Progress Notes (Signed)
Recreation Therapy Notes  Date: 11/18/15  Time: 0930 Location: [address] Group Room  Group Topic: Stress Management  Goal Area(s) Addresses:  Patient will verbalize importance of using healthy stress management.  Patient will identify positive emotions associated with healthy stress management.   Intervention: Stress Management  Activity :  Deep Breathing.  LRT introduced patients to the stress management technique of deep breathing.  Pt were to follow along as LRT demonstrated and read a script to allow patients to participate in the activity.  Education:  Stress Management, Discharge Planning.   Education Outcome: Acknowledges edcuation/In group clarification offered/Needs additional education  Clinical Observations/Feedback: Pt did not attend group.    Annalisa Colonna, LRT/CTRS  

## 2015-11-18 NOTE — Progress Notes (Signed)
Adult Psychoeducational Group Note  Date:  11/18/2015 Time:  10:24 PM  Group Topic/Focus:  Wrap-Up Group:   The focus of this group is to help patients review their daily goal of treatment and discuss progress on daily workbooks.   Participation Level:  Active  Participation Quality:  Appropriate  Affect:  Appropriate  Cognitive:  Alert  Insight: Appropriate  Engagement in Group:  Engaged  Modes of Intervention:  Discussion  Additional Comments:  On a scale between 1-10, (1=worse, 10=best) patient rated his day a 10. Patient stated he got to see his family today and will hopefully be discharging tomorrow. Uilani Sanville L Arnol Mcgibbon 11/18/2015, 10:24 PM

## 2015-11-19 DIAGNOSIS — F322 Major depressive disorder, single episode, severe without psychotic features: Secondary | ICD-10-CM

## 2015-11-19 MED ORDER — FLUOXETINE HCL 20 MG PO CAPS: 20.0000 mg | ORAL_CAPSULE | Freq: Every day | ORAL | 0 refills | Status: DC

## 2015-11-19 MED ORDER — HYDROXYZINE HCL 25 MG PO TABS: 25.0000 mg | ORAL_TABLET | Freq: Four times a day (QID) | ORAL | 0 refills | Status: DC | PRN

## 2015-11-19 MED ORDER — TRAZODONE HCL 50 MG PO TABS: 50.0000 mg | ORAL_TABLET | Freq: Every evening | ORAL | 0 refills | Status: DC | PRN
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
## 2015-11-19 NOTE — Progress Notes (Signed)
  [facility] Discharge [facility] :  Will you be returning to the same living situation after discharge:  Yes,  Pt returning home with family  At discharge, do you have transportation home?: Yes,  Pt mother to pick up Do you have the ability to pay for your medications: Yes,  Pt provided with samples and prescriptions   Release of information consent forms completed and in the chart;  Patient's signature needed at discharge.  Patient to Follow up at: Follow-up Information    [facility]   Specialty:  Behavioral Health Why:  Please use the walk-in clinic between 8am-3pm Monday-Friday to be established with services for medication management and therapy. Contact information: [address] [place] [address] [phone]           Next level of care provider has access to [facility] Link:no  Safety Planning and Suicide Prevention discussed: Yes,  with mother; see SPE note  Have you used any form of tobacco in the last 30 days? (Cigarettes, Smokeless Tobacco, Cigars, and/or Pipes): Yes  Has patient been referred to the Quitline?: Patient refused referral  Patient has been referred for addiction treatment: Yes  [name] 11/19/2015, 9:57 AM

## 2015-11-19 NOTE — Progress Notes (Signed)
Discharge note: Pt received both written and verbal discharge instructions. Pt verbalized understanding of discharge instructions. Pt agreed to f/u appt and med regimen. Pt received sample meds and prescriptions. Pt obtained belongings from room and locker. Pt safely discharged to lobby.
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
## 2015-11-19 NOTE — Tx Team (Signed)
Interdisciplinary Treatment Plan Update (Adult) Date: 11/19/2015   Date: 11/19/2015 9:56 AM  Progress in Treatment:  Attending groups: Minimally  Participating in groups: Minimally  Taking medication as prescribed: Yes  Tolerating medication: Yes  Family/Significant othe contact made: Yes with mother Patient understands diagnosis: Insight improving Discussing patient identified problems/goals with staff: Yes  Medical problems stabilized or resolved: Yes  Denies suicidal/homicidal ideation: Yes Patient has not harmed self or Others: Yes   New problem(s) identified: None identified at this time.   Discharge Plan or Barriers: Pt will return home and follow-up with outpatient services.   Additional comments:  Patient and CSW reviewed pt's identified goals and treatment plan. Patient verbalized understanding and agreed to treatment plan.   Reason for Continuation of Hospitalization:  Depression Medication stabilization Suicidal ideation  Estimated length of stay: 0 days  Review of initial/current patient goals per problem list:   1.  Goal(s): Patient will participate in aftercare plan  Met:  Yes  Target date: 3-5 days from date of admission   As evidenced by: Patient will participate within aftercare plan AEB aftercare provider and housing plan at discharge being identified.   11/17/15: Pt will return home and follow-up with outpatient services.   2.  Goal (s): Patient will exhibit decreased depressive symptoms and suicidal ideations.  Met:  Yes  Target date: 3-5 days from date of admission   As evidenced by: Patient will utilize self rating of depression at 3 or below and demonstrate decreased signs of depression or be deemed stable for discharge by MD.  11/17/15: Pt reports no depression today; denies SI   Attendees:  Patient:    Family:    Physician: Dr. Parke Poisson, MD  11/19/2015 9:56 AM  Nursing: Darrol Angel, RN 11/19/2015 9:56 AM  Clinical Social Worker Peri Maris,  [place] [facility] 11/19/2015 9:56 AM  Other: Tilden Fossa, [place] 11/19/2015 9:56 AM  Clinical:  11/19/2015 9:56 AM  Other:  11/19/2015 9:56 AM  Other:     Peri Maris, [place] Work [phone]

## 2015-11-19 NOTE — [facility] Suicide Risk Assessment (Signed)
[facility] INPATIENT:  Family/Significant Other Suicide Prevention Education  Suicide Prevention Education:  Education Completed; Martin PitchMichelle Greene, Pt's mother [phone], has been identified by the patient as the family member/significant other with whom the patient will be residing, and identified as the person(s) who will aid the patient in the event of a mental health crisis (suicidal ideations/suicide attempt).  With written consent from the patient, the family member/significant other has been provided the following suicide prevention education, prior to the and/or following the discharge of the patient.  The suicide prevention education provided includes the following:  Suicide risk factors  Suicide prevention and interventions  National Suicide Hotline telephone number  [facility] assessment telephone number  [facility] Emergency Assistance 911  [facility] and/or Residential Mobile Crisis Unit telephone number  Request made of family/significant other to:  Remove weapons (e.g., guns, rifles, knives), all items previously/currently identified as safety concern.    Remove drugs/medications (over-the-counter, prescriptions, illicit drugs), all items previously/currently identified as a safety concern.  The family member/significant other verbalizes understanding of the suicide prevention education information provided.  The family member/significant other agrees to remove the items of safety concern listed above.  Martin HoopsLauren M Greene 11/19/2015, 9:54 AM

## 2017-09-28 ENCOUNTER — Encounter (HOSPITAL_COMMUNITY): Payer: Self-pay | Admitting: *Deleted

## 2017-09-28 ENCOUNTER — Emergency Department (HOSPITAL_COMMUNITY)
Admission: EM | Admit: 2017-09-28 | Discharge: 2017-09-28 | Disposition: A | Discharge status: Home / Self Care | Payer: Self-pay | Attending: Emergency Medicine | Admitting: Emergency Medicine

## 2017-09-28 ENCOUNTER — Other Ambulatory Visit: Payer: Self-pay

## 2017-09-28 DIAGNOSIS — F17210 Nicotine dependence, cigarettes, uncomplicated: Secondary | ICD-10-CM | POA: Insufficient documentation

## 2017-09-28 DIAGNOSIS — K0889 Other specified disorders of teeth and supporting structures: Secondary | ICD-10-CM | POA: Insufficient documentation

## 2017-09-28 DIAGNOSIS — Z79899 Other long term (current) drug therapy: Secondary | ICD-10-CM | POA: Insufficient documentation

## 2017-09-28 MED ORDER — LIDOCAINE VISCOUS HCL 2 % MT SOLN: OROMUCOSAL | 0 refills | Status: DC

## 2017-09-28 MED ORDER — NAPROXEN 500 MG PO TABS: 500.0000 mg | ORAL_TABLET | Freq: Two times a day (BID) | ORAL | 0 refills | Status: DC

## 2017-09-28 MED ORDER — PENICILLIN V POTASSIUM 500 MG PO TABS: 500.0000 mg | ORAL_TABLET | Freq: Four times a day (QID) | ORAL | 0 refills | Status: DC

## 2017-09-28 NOTE — ED Provider Notes (Signed)
[facility] EMERGENCY DEPARTMENT Provider Note   CSN: [id number] Arrival date & time: 09/28/17  0037     History   Chief Complaint Chief Complaint  Patient presents with  . Dental Pain    HPI Martin Greene is a 22 y.o. male.  The history is provided by the patient and medical records.     22 year old male with history of depression, presenting to the ED for right lower dental pain.  States began yesterday morning and has been persistent throughout the day.  Does have broken tooth in this area that broke months ago but never caused any issues until now.  States he was planning to go to the dentist in the morning but pain became intolerable.  He has been taking ibuprofen without any relief.  He denies any fever or chills.  No facial swelling or trouble swallowing.  History reviewed. No pertinent past medical history.  Patient Active Problem List   Diagnosis Date Noted  . Severe single current episode of major depressive disorder, without psychotic features (HCC)   . MDD (major depressive disorder) 11/16/2015    History reviewed. No pertinent surgical history.      Home Medications    Prior to Admission medications   Medication Sig Start Date End Date Taking? Authorizing Provider  FLUoxetine (PROZAC) 20 MG capsule Take 1 capsule (20 mg total) by mouth daily. 11/19/15   Adonis Brook, NP  hydrOXYzine (ATARAX/VISTARIL) 25 MG tablet Take 1 tablet (25 mg total) by mouth every 6 (six) hours as needed for anxiety. 11/19/15   Adonis Brook, NP  traZODone (DESYREL) 50 MG tablet Take 1 tablet (50 mg total) by mouth at bedtime and may repeat dose one time if needed. 11/19/15   Adonis Brook, NP    Family History No family history on file.  Social History Social History   Tobacco Use  . Smoking status: Current Some Day Smoker    Packs/day: 0.25    Years: 3.00    Pack years: 0.75    Types: Cigarettes  . Smokeless tobacco: Never Used  Substance Use  Topics  . Alcohol use: No  . Drug use: Yes    Types: Marijuana     Allergies   Patient has no known allergies.   Review of Systems Review of Systems  HENT: Positive for dental problem.   All other systems reviewed and are negative.    Physical Exam Updated Vital Signs BP (!) 161/83 (BP Location: Right Arm)   Pulse 69   Temp 98 F (36.7 C) (Oral)   Resp 14   Ht 5\' 6"  (1.676 m)   Wt 68 kg (150 lb)   SpO2 99%   BMI 24.21 kg/m   Physical Exam  Constitutional: He is oriented to person, place, and time. He appears well-developed and well-nourished.  HENT:  Head: Normocephalic and atraumatic.  Mouth/Throat: Oropharynx is clear and moist.  Teeth largely in poor dentition, tooth #28 broken, surrounding gingiva appears irritated but no drainable fluid collection, handling secretions appropriately, no trismus, no facial or neck swelling, normal phonation without stridor  Eyes: Pupils are equal, round, and reactive to light. Conjunctivae and EOM are normal.  Neck: Normal range of motion.  Cardiovascular: Normal rate, regular rhythm and normal heart sounds.  Pulmonary/Chest: Effort normal and breath sounds normal. No stridor. No respiratory distress.  Abdominal: Soft. Bowel sounds are normal. There is no tenderness. There is no rebound.  Musculoskeletal: Normal range of motion.  Neurological: He is  alert and oriented to person, place, and time.  Skin: Skin is warm and dry.  Psychiatric: He has a normal mood and affect.  Nursing note and vitals reviewed.    ED Treatments / Results  Labs (all labs ordered are listed, but only abnormal results are displayed) Labs Reviewed - No data to display  EKG None  Radiology No results found.  Procedures Procedures (including critical care time)  Medications Ordered in ED Medications - No data to display   Initial Impression / Assessment and Plan / ED Course  I have reviewed the triage vital signs and the nursing  notes.  Pertinent labs & imaging results that were available during my care of the patient were reviewed by me and considered in my medical decision making (see chart for details).  22 year old male here with right lower dental pain.  Has broken right lower premolar with exposed root.  There is no drainable fluid collection to suggest abscess.  He has no facial or neck swelling.  No signs or symptoms concerning for Ludwig's angina.  Will cover with antibiotics for infection prophylaxis, try and control pain.  He was instructed to follow-up with dentist as soon as possible.  Discussed plan with patient, he acknowledged understanding and agreed with plan of care.  Return precautions given for new or worsening symptoms.  Final Clinical Impressions(s) / ED Diagnoses   Final diagnoses:  Pain, dental    ED Discharge Orders        Ordered    lidocaine (XYLOCAINE) 2 % solution     09/28/17 0234    naproxen (NAPROSYN) 500 MG tablet  2 times daily with meals     09/28/17 0234    penicillin v potassium (VEETID) 500 MG tablet  4 times daily     09/28/17 0235       Garlon HatchetSanders, Lisa M, PA-C 09/28/17 0243    Melene PlanFloyd, Dan, DO 09/28/17 [phone]

## 2017-09-28 NOTE — ED Triage Notes (Signed)
Pt started having bottom, R sided dental pain onset tonight. Has taken ibuprofen without improvement

## 2017-09-28 NOTE — Discharge Instructions (Signed)
Take the prescribed medication as directed. Follow-up with dentist.  Resource guide attached to help with this. Return to the ED for new or worsening symptoms.

## 2018-12-27 ENCOUNTER — Other Ambulatory Visit: Payer: Self-pay

## 2018-12-27 ENCOUNTER — Encounter (HOSPITAL_COMMUNITY): Payer: Self-pay

## 2018-12-27 ENCOUNTER — Emergency Department (HOSPITAL_COMMUNITY)
Admission: EM | Admit: 2018-12-27 | Discharge: 2018-12-27 | Disposition: A | Discharge status: Home / Self Care | Payer: Medicaid Other | Attending: Emergency Medicine | Admitting: Emergency Medicine

## 2018-12-27 DIAGNOSIS — Z79899 Other long term (current) drug therapy: Secondary | ICD-10-CM | POA: Insufficient documentation

## 2018-12-27 DIAGNOSIS — F17210 Nicotine dependence, cigarettes, uncomplicated: Secondary | ICD-10-CM | POA: Insufficient documentation

## 2018-12-27 DIAGNOSIS — K029 Dental caries, unspecified: Secondary | ICD-10-CM | POA: Insufficient documentation

## 2018-12-27 DIAGNOSIS — F1290 Cannabis use, unspecified, uncomplicated: Secondary | ICD-10-CM | POA: Insufficient documentation

## 2018-12-27 MED ORDER — MELOXICAM 7.5 MG PO TABS: 7.5000 mg | ORAL_TABLET | Freq: Every day | ORAL | 0 refills | Status: AC

## 2018-12-27 NOTE — Discharge Instructions (Signed)
Take penicillin as previously prescribed and complete the full course. Take meloxicam daily as prescribed for pain.  You may also take Tylenol with this.

## 2018-12-27 NOTE — ED Provider Notes (Signed)
[facility] Provider Note   CSN: [id number] Arrival date & time: 12/27/18  1516     History   Chief Complaint Chief Complaint  Patient presents with  . Dental Pain    HPI [facility] is a 22 y.o. male.     23 year old male presents with complaint of left upper tooth pain for the past few days.  Patient denies trauma, fever, drainage.  Patient states that he is taking naproxen without relief, reports throbbing pain involving the tooth, suspect the tooth is broken.  No other complaints or concerns.     History reviewed. No pertinent past medical history.  Patient Active Problem List   Diagnosis Date Noted  . Severe single current episode of major depressive disorder, without psychotic features ([place])   . MDD (major depressive disorder) 11/16/2015    History reviewed. No pertinent surgical history.      Home Medications    Prior to Admission medications   Medication Sig Start Date End Date Taking? Authorizing Provider  FLUoxetine (PROZAC) 20 MG capsule Take 1 capsule (20 mg total) by mouth daily. 11/19/15   Kerrie Buffalo, NP  hydrOXYzine (ATARAX/VISTARIL) 25 MG tablet Take 1 tablet (25 mg total) by mouth every 6 (six) hours as needed for anxiety. 11/19/15   Kerrie Buffalo, NP  lidocaine (XYLOCAINE) 2 % solution Apply topically every 2 hours as needed for pain. 09/28/17   Larene Pickett, PA-C  meloxicam (MOBIC) 7.5 MG tablet Take 1 tablet (7.5 mg total) by mouth daily for 10 days. 12/27/18 01/06/19  Tacy Learn, PA-C  naproxen (NAPROSYN) 500 MG tablet Take 1 tablet (500 mg total) by mouth 2 (two) times daily with a meal. 09/28/17   Larene Pickett, PA-C  penicillin v potassium (VEETID) 500 MG tablet Take 1 tablet (500 mg total) by mouth 4 (four) times daily. 09/28/17   Larene Pickett, PA-C  traZODone (DESYREL) 50 MG tablet Take 1 tablet (50 mg total) by mouth at bedtime and may repeat dose one time if needed. 11/19/15   Kerrie Buffalo, NP    Family History History reviewed. No pertinent family history.  Social History Social History   Tobacco Use  . Smoking status: Current Some Day Smoker    Packs/day: 0.25    Years: 3.00    Pack years: 0.75    Types: Cigarettes  . Smokeless tobacco: Never Used  Substance Use Topics  . Alcohol use: No  . Drug use: Yes    Types: Marijuana     Allergies   Patient has no known allergies.   Review of Systems Review of Systems  Constitutional: Negative for fever.  HENT: Positive for dental problem. Negative for congestion, ear pain and facial swelling.   Skin: Negative for rash and wound.  Allergic/Immunologic: Negative for immunocompromised state.  Neurological: Negative for headaches.  Hematological: Negative for adenopathy.  Psychiatric/Behavioral: Negative for confusion.  All other systems reviewed and are negative.    Physical Exam Updated Vital Signs BP 127/75 (BP Location: Right Arm)   Pulse 66   Temp 98 F (36.7 C) (Oral)   Resp 16   Ht 5\' 6"  (1.676 m)   Wt 72.6 kg   SpO2 98%   BMI 25.82 kg/m   Physical Exam Vitals signs and nursing note reviewed.  Constitutional:      General: He is not in acute distress.    Appearance: He is well-developed. He is not diaphoretic.  HENT:  Head: Normocephalic and atraumatic.     Mouth/Throat:     Mouth: Mucous membranes are moist.     Dentition: Dental tenderness and dental caries present. No gingival swelling or dental abscesses.     Pharynx: No oropharyngeal exudate or posterior oropharyngeal erythema.   Neck:     Musculoskeletal: Neck supple.  Pulmonary:     Effort: Pulmonary effort is normal.  Lymphadenopathy:     Cervical: No cervical adenopathy.  Skin:    General: Skin is warm and dry.     Findings: No erythema or rash.  Neurological:     Mental Status: He is alert and oriented to person, place, and time.  Psychiatric:        Behavior: Behavior normal.      ED Treatments / Results   Labs (all labs ordered are listed, but only abnormal results are displayed) Labs Reviewed - No data to display  EKG None  Radiology No results found.  Procedures Procedures (including critical care time)  Medications Ordered in ED Medications - No data to display   Initial Impression / Assessment and Plan / ED Course  I have reviewed the triage vital signs and the nursing notes.  Pertinent labs & imaging results that were available during my care of the patient were reviewed by me and considered in my medical decision making (see chart for details).  Clinical Course as of Dec 27 1807  Thu Dec 27, 2018  1804 23 yo male with complaint of left upper teeth without traumatic injury.  Patient has a dental cavity exposing the posterior buccal portion of his left upper molar.  There is no gingival swelling or obvious abscess.  Patient has a full bottle of penicillin from his previous ER visit that he never took.  Advised patient to take this and complete the full course.  Discontinued in approximately take meloxicam.  Patient given dental referral sheet.   [LM]    Clinical Course User Index [LM] Jeannie FendMurphy, Laura A, PA-C      Final Clinical Impressions(s) / ED Diagnoses   Final diagnoses:  Dental caries    ED Discharge Orders         Ordered    meloxicam (MOBIC) 7.5 MG tablet  Daily     12/27/18 1711           Jeannie FendMurphy, Laura A, PA-C 12/27/18 1809    Sabas SousBero, Michael M, MD 12/29/18 [id number]

## 2018-12-27 NOTE — ED Notes (Signed)
Patient Alert and oriented to baseline. Stable and ambulatory to baseline. Patient verbalized understanding of the discharge instructions.  Patient belongings were taken by the patient.   

## 2018-12-27 NOTE — ED Triage Notes (Signed)
PT arrives POV for eval of L sided dental pain onset 2 days PTA. Pt had similar problem last year, but reports it is on the opposite side now.

## 2019-06-08 ENCOUNTER — Other Ambulatory Visit: Payer: Self-pay

## 2019-06-08 DIAGNOSIS — Z20822 Contact with and (suspected) exposure to covid-19: Secondary | ICD-10-CM

## 2019-06-10 LAB — NOVEL CORONAVIRUS, NAA: SARS-CoV-2, NAA: NOT DETECTED

## 2019-10-01 ENCOUNTER — Ambulatory Visit (HOSPITAL_COMMUNITY): Payer: Federal, State, Local not specified - Other | Admitting: Licensed Clinical Social Worker

## 2019-10-04 ENCOUNTER — Other Ambulatory Visit: Payer: Self-pay

## 2019-10-04 ENCOUNTER — Ambulatory Visit (HOSPITAL_COMMUNITY)
Admission: EM | Admit: 2019-10-04 | Discharge: 2019-10-04 | Disposition: A | Discharge status: Home / Self Care | Payer: No Payment, Other | Attending: Psychiatry | Admitting: Psychiatry

## 2019-10-04 DIAGNOSIS — F329 Major depressive disorder, single episode, unspecified: Secondary | ICD-10-CM | POA: Diagnosis not present

## 2019-10-04 DIAGNOSIS — F332 Major depressive disorder, recurrent severe without psychotic features: Secondary | ICD-10-CM

## 2019-10-04 DIAGNOSIS — Z915 Personal history of self-harm: Secondary | ICD-10-CM | POA: Diagnosis not present

## 2019-10-04 NOTE — Discharge Instructions (Addendum)
Take all of you medications as prescribed by your mental healthcare provider.  Report any adverse effects and reactions from your medications to your outpatient provider promptly.  Do not engage in alcohol and or illegal drug use while on prescription medicines. Keep all scheduled appointments. This is to ensure that you are getting refills on time and to avoid any interruption in your medication.  If you are unable to keep an appointment call to reschedule.  Be sure to follow up with resources and follow ups given. In the event of worsening symptoms call the crisis hotline, 911, and or go to the nearest emergency department for appropriate evaluation and treatment of symptoms. Follow-up with your primary care provider for your medical issues, concerns and or health care needs.    You have an appt scheduled with [facility] on 7/12 at 11:00AM.    As a former [facility] patient, you are able to present to [facility] pharmacy for a medication refill to last you until your 7/12 appt.   Present to:  [facility] [address] [place], [place]  Let them know you have a scheduled appt on 7/12.   You are encouraged to continue with outpatient therapy weekly here at [facility].

## 2019-10-04 NOTE — ED Provider Notes (Signed)
Behavioral Health Medical Screening Exam  [facility] is a 24 y.o. male. Patient presents voluntarily to [facility] behavioral health center for walk-in assessment. Patient assessed by nurse practitioner.  Patient alert and oriented, answers appropriately. Patient reports he is here for prescription refills.  Patient reports he is seen by [facility] but has appointment with [facility] on July 12.  Patient reports not taking medications x2 weeks.  Patient reports he "ran out." Patient denies suicidal ideations.  Patient reports history of suicide attempt once, at age 55.  Patient denies self-harm behaviors.  Patient denies homicidal ideations.  Patient denies auditory visual hallucinations.  Patient denies symptoms of paranoia. Patient reports current stressor is his mother who recently shared with him that he will be asked to "get his life together and move out of the family home in approximately 6 months." Patient currently resides in [place] with his mother, brother and 2 grandparents.  Patient denies access to weapons.  Patient endorses substance use.  Patient reports use of alcohol approximately once per week and marijuana daily.  Patient denies offer of substance use resources.  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  Patient declines anyone to contact for collateral information at this time. Patient offered support and encouragement. Patient given work excuse as requested.  Total Time spent with patient: 20 minutes  Psychiatric Specialty Exam  Presentation  General Appearance:Appropriate for Environment  Eye Contact:Fair  Speech:Clear and Coherent;Normal Rate  Speech Volume:Normal  Handedness:Right   Mood and Affect  Mood:Depressed  Affect:Congruent;Appropriate   Thought Process  Thought Processes:Coherent;Goal Directed  Descriptions of  Associations:Intact  Orientation:Full (Time, Place and Person)  Thought Content:Logical;WDL  Hallucinations:None  Ideas of Reference:None  Suicidal Thoughts:No  Homicidal Thoughts:No   Sensorium  Memory:Immediate Good;Recent Good;Remote Good  Judgment:Good  Insight:Good   Executive Functions  Concentration:Good  Attention Span:Good  Recall:Good  Fund of Knowledge:Good  Language:Good   Psychomotor Activity  Psychomotor Activity:Normal   Assets  Assets:Communication Skills;Desire for Improvement;Financial Resources/Insurance;Housing;Intimacy;Leisure Time;Physical Health;Resilience;Social Support;Talents/Skills;Transportation;Vocational/Educational   Sleep  Sleep:Fair  Number of hours: 6   Physical Exam: Physical Exam Vitals and nursing note reviewed.  Constitutional:      Appearance: He is well-developed.  HENT:     Head: Normocephalic.  Cardiovascular:     Rate and Rhythm: Normal rate.  Pulmonary:     Effort: Pulmonary effort is normal.  Neurological:     Mental Status: He is alert and oriented to person, place, and time.  Psychiatric:        Attention and Perception: Attention and perception normal.        Mood and Affect: Affect normal. Mood is depressed.        Speech: Speech normal.        Behavior: Behavior normal. Behavior is cooperative.        Thought Content: Thought content normal.        Cognition and Memory: Cognition normal.    Review of Systems  Constitutional: Negative.   HENT: Negative.   Eyes: Negative.   Respiratory: Negative.   Cardiovascular: Negative.   Gastrointestinal: Negative.   Genitourinary: Negative.   Musculoskeletal: Negative.   Skin: Negative.   Neurological: Negative.   Endo/Heme/Allergies: Negative.   Psychiatric/Behavioral: Positive for depression.   Blood pressure 95/66, pulse (!) 51, temperature 98 F (36.7 C), temperature source Temporal, resp. rate 18, height 5\' 6"  (1.676 m), weight 180 lb (81.6  kg), SpO2  99 %. Body mass index is 29.05 kg/m.  Musculoskeletal: Strength & Muscle Tone: within normal limits Gait & Station: normal Patient leans: N/A   Recommendations: Patient discussed with Dr. Dwyane Dee. Patient will be able to pick up medications from [place], patient given directions to pharmacy. Patient will follow up with scheduled outpatient appointment at [facility] on July 12 for medication management. Patient will follow up at [facility] at first available appointment for talk therapy.  Based on my evaluation the patient does not appear to have an emergency medical condition.  Emmaline Kluver, FNP 10/04/2019, 5:18 PM
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
## 2019-10-18 ENCOUNTER — Ambulatory Visit (INDEPENDENT_AMBULATORY_CARE_PROVIDER_SITE_OTHER): Payer: No Payment, Other | Admitting: Licensed Clinical Social Worker

## 2019-10-18 ENCOUNTER — Other Ambulatory Visit: Payer: Self-pay

## 2019-10-18 DIAGNOSIS — F332 Major depressive disorder, recurrent severe without psychotic features: Secondary | ICD-10-CM

## 2019-10-21 ENCOUNTER — Ambulatory Visit (HOSPITAL_COMMUNITY): Payer: Federal, State, Local not specified - Other | Admitting: Psychiatry
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
## 2019-11-12 ENCOUNTER — Ambulatory Visit (INDEPENDENT_AMBULATORY_CARE_PROVIDER_SITE_OTHER): Payer: No Payment, Other | Admitting: Licensed Clinical Social Worker

## 2019-11-12 ENCOUNTER — Other Ambulatory Visit: Payer: Self-pay

## 2019-11-12 DIAGNOSIS — F332 Major depressive disorder, recurrent severe without psychotic features: Secondary | ICD-10-CM | POA: Diagnosis not present

## 2019-11-13 NOTE — Progress Notes (Signed)
   THERAPIST PROGRESS NOTE  Session Time: 55 min  Participation Level: Active  Behavioral Response: CasualAlertDepressed and Dysphoric  Type of Therapy: Individual Therapy  Treatment Goals addressed: Coping  Interventions: Motivational Interviewing, Supportive and Other: Additional Assessment  Summary: Martin Greene is a 24 y.o. male who presents with hx of MDD. This is the first f/u session since initial eval. Martin Greene comes for in person session. He presents as somewhat less depressed and agrees he is some better. He reports he is still having a difficult time with overall motivation and follow through, crying less often. He reports his depressive feelings on a scale of 0-10 is a "5". He feels anx is more of a problem of late. He reports anx is a 7 or 8 on a 0-10 scale. Pt reports racing thoughts, problems with flashbacks of past, poor focus. Pt advises he is stressing about work and needing to get to work. He is looking for work but anx about how he will be able to manage given his MH status. Assessed for resume. Pt reports he does have a current resume he put together in one day with the help of an app. LCSW acknowledged this accomplishment. Pt states he is also feeling anx about needing to get his drivers license and how to go about it. He declares he needs someone to help him figure out what to do "step by step". LCSW respectfully challenged this belief and helped to process thoughts and feelings. Pt agrees to do some of his own research on steps for license. Martin Greene reports a new stressor with his best friend of 15 yrs, Martin Greene (whom he has not mentioned prior). Martin Greene provides details and states they are not speaking, which is upsetting to him.[name] He also reports additional details of his interactions with his younger brother in the home who is 19 and described to have substantial anger problems. He reports relationship with his mother is going better of late. "She's trying to be nicer". He feels this  is r/t his birthday approaching and is looking forward to his birthday. He thinks there will be a celebration but is not positive. Assessed for coping skills. Pt reports he is writing more as he is working on a book idea, he is also drawing and doing more regular exercise. Reports he has lost 6 lbs he is pleased about. Pt states "I want to feel good" and he is able to endorse hope. LCSW reviewed poc with pt's verbal agreement prior to close of session. Pt states appreciation for care.   Suicidal/Homicidal: Nowithout intent/plan  Therapist Response: Pt remains receptive to care. Interest in ROTC.  Plan: Return again in 1 week.  Diagnosis: Axis I: Major Depression, Recurrent severe    Axis II: Deferred  [place] Sink, LCSW 11/13/2019

## 2019-11-19 ENCOUNTER — Other Ambulatory Visit: Payer: Self-pay

## 2019-11-19 ENCOUNTER — Ambulatory Visit (INDEPENDENT_AMBULATORY_CARE_PROVIDER_SITE_OTHER): Payer: No Payment, Other | Admitting: Licensed Clinical Social Worker

## 2019-11-19 DIAGNOSIS — F332 Major depressive disorder, recurrent severe without psychotic features: Secondary | ICD-10-CM | POA: Diagnosis not present
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
## 2019-11-26 ENCOUNTER — Ambulatory Visit (HOSPITAL_COMMUNITY): Payer: Self-pay | Admitting: Licensed Clinical Social Worker

## 2019-12-03 ENCOUNTER — Ambulatory Visit (HOSPITAL_COMMUNITY): Payer: Self-pay | Admitting: Licensed Clinical Social Worker

## 2019-12-10 ENCOUNTER — Ambulatory Visit (HOSPITAL_COMMUNITY): Payer: Self-pay | Admitting: Licensed Clinical Social Worker

## 2019-12-17 ENCOUNTER — Other Ambulatory Visit: Payer: Self-pay

## 2019-12-17 ENCOUNTER — Ambulatory Visit (INDEPENDENT_AMBULATORY_CARE_PROVIDER_SITE_OTHER): Payer: No Payment, Other | Admitting: Licensed Clinical Social Worker

## 2019-12-17 DIAGNOSIS — F332 Major depressive disorder, recurrent severe without psychotic features: Secondary | ICD-10-CM | POA: Diagnosis not present

## 2019-12-17 NOTE — Progress Notes (Signed)
° °  THERAPIST PROGRESS NOTE  Session Time: 55 min  Participation Level: Active  Behavioral Response: CasualAlertDepressed and Dysphoric  Type of Therapy: Individual Therapy  Treatment Goals addressed: Coping  Interventions: CBT and Supportive  Summary: Martin Greene is a 24 y.o. male who presents with hx of MDD. This date pt comes for in person session. He presents as very depressed. He has missed several preceding appts which was addressed. Pt states he tried to call but only got voice mail and was not sure what prompt to follow. Pt reports he has had pink eye for 2 weeks. Pt brings up his birthday and states his birthday went poorly providing multiple details. He is distressed his best friend did not come and feels he has lost this friendship. He points out disparities in what his mother does for his sister and brother on their birthdays versus what she does for him. He reports the only gift he got from his mother was some chocolate in a mug yet his siblings get multiple gifts. He further states he and his mother got into a big argument after his birthday because she was complaining to him about how much money she spent for his party. Pt states "My family is the reason I felt suicidal before". He states "I hate my life". He denies current SI. Pt reports he is primarily isolating himself in his room to avoid conflict. LCSW facilitated discussion about his toxic living arrangement and problem solving re ways to change his situation. Pt becomes defensive about locating work to give himself options and get him out of the house. He makes multiple negative statements about his inabilities and how his fam is on him about work. Again reviewed self talk. Pt becomes more defensive and says "I'm done. The whole world is against me. I have no one on my side". LCSW reframed as indicated assisting to process thoughts and feelings. Pt realigns and apologizes for becoming defensive. LCSW reviewed coping strategies.  Pt states he is willing to reengage in some drawing but has no blank paper. LCSW provided some blank paper and pt will bring a drawing to next session. He states he also likes to work on Diplomatic Services operational officer songs. Pt continues to exercise in his room most days. LCSW provided encouragement. Reviewed poc including scheduling. Pt states understanding and appreciation for care.       Suicidal/Homicidal: Nowithout intent/plan  Therapist Response: Pt remains receptive to care.  Plan: Return again in 2 weeks.  Diagnosis: Axis I: Major Depression, Recurrent severe    Axis II: Deferred  Magalia Sink, LCSW 12/17/2019

## 2019-12-24 ENCOUNTER — Ambulatory Visit (HOSPITAL_COMMUNITY): Payer: Self-pay | Admitting: Licensed Clinical Social Worker

## 2019-12-31 ENCOUNTER — Ambulatory Visit (HOSPITAL_COMMUNITY): Payer: Self-pay | Admitting: Licensed Clinical Social Worker

## 2019-12-31 ENCOUNTER — Telehealth (HOSPITAL_COMMUNITY): Payer: Self-pay | Admitting: Licensed Clinical Social Worker

## 2019-12-31 NOTE — Telephone Encounter (Signed)
LCSW phoned pt to cancel appt this morning. His phone rang multiple times with no answer and never went to vm. LCSW phoned mother's phone which also rang multiple times but did go to vm. Left detailed message about the need to cancel 9am appt asking her to get the message to pt. Provided call back number to use prn.

## 2020-01-07 ENCOUNTER — Other Ambulatory Visit: Payer: Self-pay

## 2020-01-07 ENCOUNTER — Ambulatory Visit (HOSPITAL_COMMUNITY): Payer: Self-pay | Admitting: Licensed Clinical Social Worker

## 2020-01-07 ENCOUNTER — Ambulatory Visit (INDEPENDENT_AMBULATORY_CARE_PROVIDER_SITE_OTHER): Payer: No Payment, Other | Admitting: Licensed Clinical Social Worker

## 2020-01-07 ENCOUNTER — Ambulatory Visit (INDEPENDENT_AMBULATORY_CARE_PROVIDER_SITE_OTHER): Payer: No Payment, Other | Admitting: Psychiatry

## 2020-01-07 ENCOUNTER — Encounter (HOSPITAL_COMMUNITY): Payer: Self-pay | Admitting: Psychiatry

## 2020-01-07 VITALS — BP 134/71 | HR 72 | Resp 98 | Ht 67.0 in | Wt 165.0 lb

## 2020-01-07 DIAGNOSIS — F4312 Post-traumatic stress disorder, chronic: Secondary | ICD-10-CM

## 2020-01-07 DIAGNOSIS — F1220 Cannabis dependence, uncomplicated: Secondary | ICD-10-CM | POA: Diagnosis not present

## 2020-01-07 DIAGNOSIS — F331 Major depressive disorder, recurrent, moderate: Secondary | ICD-10-CM | POA: Diagnosis not present

## 2020-01-07 DIAGNOSIS — F3175 Bipolar disorder, in partial remission, most recent episode depressed: Secondary | ICD-10-CM | POA: Diagnosis not present

## 2020-01-07 MED ORDER — SERTRALINE HCL 50 MG PO TABS: 50.0000 mg | ORAL_TABLET | Freq: Every day | ORAL | 1 refills | Status: DC

## 2020-01-07 MED ORDER — HYDROXYZINE HCL 25 MG PO TABS: ORAL_TABLET | ORAL | 1 refills | Status: DC

## 2020-01-07 MED ORDER — ARIPIPRAZOLE 10 MG PO TABS: 10.0000 mg | ORAL_TABLET | Freq: Every day | ORAL | 1 refills | Status: DC
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
## 2020-01-08 NOTE — Progress Notes (Signed)
   THERAPIST PROGRESS NOTE  Session Time: 20 min  Participation Level: Active  Behavioral Response: CasualAlertDepressed  Type of Therapy: Individual Therapy  Treatment Goals addressed: Coping  Interventions: Supportive  Summary: Martin Greene is a 24 y.o. male who presents with hx of MDD. This date pt is here for med management appointment and seen by this clinician as a walk in. Pt reports he is doing "good". Pt states admin staff told him this clinician available and he wanted to come in long enough to update LCSW on work status. Pt states he has secured a job with [employer]. He is not completely sure what all of his work duties will be but is starting work Advertising account executive. Pt says he is excited about new opportunity with some expected nervousness. He reports he will be making $10 per hr and is looking forward to the income. He will be working 5pm-11:30pm. Pt's mood is uplifted by this development. LCSW congratulated pt for his accomplishment and provided encouragement. LCSW assessed for status of loss of best friend while pt here. He states they have not reconnected. He further states "I'm not letting it bother me. Friends talk things out so that must be a friendship I don't need". In talking about relationships he mentions he was not invited to his sister's birthday party. He also states "I'm not letting this bother me either". He denies doing any drawing but states he will bring a drawing to next session. Pt has his ride waiting outside so session ended early. Pt states appreciation for care.     Suicidal/Homicidal: Nowithout intent/plan  Therapist Response: Pt remains receptive to care.  Plan: Return again in 4 weeks.  Diagnosis: Axis I: MDD, recurrent, moderate    Axis II: Deferred   Sink, LCSW 01/08/2020

## 2020-02-07 ENCOUNTER — Ambulatory Visit (HOSPITAL_COMMUNITY): Payer: Federal, State, Local not specified - Other | Admitting: Licensed Clinical Social Worker

## 2020-03-10 ENCOUNTER — Ambulatory Visit (HOSPITAL_COMMUNITY): Payer: Federal, State, Local not specified - Other | Admitting: Psychiatry

## 2020-03-19 ENCOUNTER — Other Ambulatory Visit: Payer: Self-pay

## 2020-03-19 ENCOUNTER — Ambulatory Visit (INDEPENDENT_AMBULATORY_CARE_PROVIDER_SITE_OTHER): Payer: No Payment, Other | Admitting: Licensed Clinical Social Worker

## 2020-03-19 DIAGNOSIS — F331 Major depressive disorder, recurrent, moderate: Secondary | ICD-10-CM | POA: Diagnosis not present

## 2020-03-23 NOTE — Progress Notes (Signed)
   THERAPIST PROGRESS NOTE  Session Time: 45 min  Participation Level: Active  Behavioral Response: CasualAlertDepressed  Type of Therapy: Individual Therapy  Treatment Goals addressed: Communication: Depression/coping  Interventions: Motivational Interviewing, Solution Focused and Supportive  Summary: Martin Greene is a 24 y.o. male who presents with hx of MDD. This date pt comes for in person session per his preference. Pt reports he is doing fairly well overall. He reports his feelings of dep are "6" on a 0-10 scale with 10 the worst. Pt continues to have his job at [employer] but states they are on break for the holiday. He states "I really like it and hope I can stay". Pt is a Public affairs consultant in cafeteria. Pt seems somewhat uncertain if he will be returning in Jan. He reports on his last shift he needed to leave early. He texted his boss rather than calling and never heard back but did leave so he has some worries about lack of communication. Pt reports it has been nice having some income. He reports since this is part time he does not make a lot of money. He is continuing to donate plasma 2xwk for income. Friend helping with all transportation. Pt reports the home environment remains a stressor with lots of ongoing arguing. He reports his mother got mad at him for buying a new play station when he should be saving. Pt states it is helpful to have play station as a distraction again. He does have goal of saving for a car. LCSW assessed for action steps/strategy. Pt is unable to define a strategy. LCSW assisted to educate on plan. Pt agrees to try strategy discussed. Pt advises his grandmother had a CVA and then fell once home breaking her leg. He speaks of how hard it is to watch her declining. LCSW provided support and assisted to process feelings. Assessed for coping with the holidays. Pt reports on usual traditions and is hopeful all will go smoothly. He denies other worries/concerns. Pt informs he  has reconnected with his long time friend [name] he is very pleased about. He reports he has been drawing and shows a pic he brought to share. LCSW commended pt for follow through. LCSW reviewed poc prior to close of session. Pt states appreciation for care.      Suicidal/Homicidal: Intermittent thoughts of SIwithout intent/plan  Therapist Response: Pt remains receptive to care.  Plan: Return again in ~ 2 weeks.  Diagnosis: Axis I: MDD, recurrent, moderate    Axis II: Deferred  Whitfield Sink, LCSW 03/23/2020

## 2020-04-09 ENCOUNTER — Ambulatory Visit (HOSPITAL_COMMUNITY): Payer: Federal, State, Local not specified - Other | Admitting: Licensed Clinical Social Worker

## 2020-04-23 ENCOUNTER — Ambulatory Visit (HOSPITAL_COMMUNITY): Payer: Self-pay | Admitting: Licensed Clinical Social Worker

## 2020-04-23 ENCOUNTER — Telehealth (HOSPITAL_COMMUNITY): Payer: Self-pay | Admitting: Licensed Clinical Social Worker

## 2020-04-23 NOTE — Telephone Encounter (Signed)
Pt had in person session this date @ 11a. When pt failed to show up LCSW called pt. Phone rang several times and went to vm. LCSW left detailed message re purpose of call. Left information re referral to [facility] in [place] with contact information as this was final appt with this clinician since pt moved to [place].

## 2020-05-04 ENCOUNTER — Ambulatory Visit (HOSPITAL_COMMUNITY): Payer: Federal, State, Local not specified - Other | Admitting: Psychiatry

## 2020-11-01 ENCOUNTER — Ambulatory Visit (HOSPITAL_COMMUNITY)
Admission: EM | Admit: 2020-11-01 | Discharge: 2020-11-01 | Disposition: A | Discharge status: Home / Self Care | Payer: Self-pay | Attending: Emergency Medicine | Admitting: Emergency Medicine

## 2020-11-01 ENCOUNTER — Encounter (HOSPITAL_COMMUNITY): Payer: Self-pay | Admitting: *Deleted

## 2020-11-01 ENCOUNTER — Other Ambulatory Visit: Payer: Self-pay

## 2020-11-01 DIAGNOSIS — R062 Wheezing: Secondary | ICD-10-CM | POA: Insufficient documentation

## 2020-11-01 DIAGNOSIS — Z202 Contact with and (suspected) exposure to infections with a predominantly sexual mode of transmission: Secondary | ICD-10-CM | POA: Insufficient documentation

## 2020-11-01 NOTE — ED Triage Notes (Signed)
Pt contacted by girlfriend to get checked for std

## 2020-11-01 NOTE — ED Provider Notes (Signed)
[facility]    CSN: [id number] Arrival date & time: 11/01/20  1526      History   Chief Complaint Chief Complaint  Patient presents with   SEXUALLY TRANSMITTED DISEASE    HPI Martin Greene is a 25 y.o. male.   HPI Martin Greene is a 25 y.o. male presenting to UC with c/o exposure to chlamydia as reported by his girlfriend.  Pt reports having protected intercourse and denies symptoms at this time but is concerned and wants to be tested.  Denies penile discharge, dysuria, abdominal pain or back pain.  On exam, pt noted to have wheeze in his lungs. Pt is a smoker, reports due to stress in his life despite having quite in the past.  Denies cough, chest pain, difficulty breathing, fever, chills, n/v/d. No hx of asthma.      History reviewed. No pertinent past medical history.  Patient Active Problem List   Diagnosis Date Noted   Bipolar 1 disorder, depressed, partial remission (HCC) 01/07/2020   Chronic post-traumatic stress disorder (PTSD) 01/07/2020   Cannabis use disorder, moderate, dependence (HCC) 01/07/2020   Severe single current episode of major depressive disorder, without psychotic features (HCC)    MDD (major depressive disorder) 11/16/2015    History reviewed. No pertinent surgical history.     Home Medications    Prior to Admission medications   Medication Sig Start Date End Date Taking? Authorizing Provider  ARIPiprazole (ABILIFY) 10 MG tablet Take 1 tablet (10 mg total) by mouth daily. TAKE 1 DAILY 01/07/20   Zena Amos, MD  hydrOXYzine (ATARAX/VISTARIL) 25 MG tablet Take 1 tablet (25 mg total) by mouth every 6 (six) hours as needed for anxiety. Patient not taking: Reported on 01/07/2020 11/19/15   Adonis Brook, NP  hydrOXYzine (ATARAX/VISTARIL) 25 MG tablet TAKE 1/2 - 1 TABLET 3 TIMES DAILY FOR  ANXIETY 01/07/20   Zena Amos, MD  sertraline (ZOLOFT) 50 MG tablet Take 1 tablet (50 mg total) by mouth daily. TAKE 1 DAILY AFTER BREAKFAST 01/07/20    Zena Amos, MD    Family History History reviewed. No pertinent family history.  Social History Social History   Tobacco Use   Smoking status: Some Days    Packs/day: 0.25    Years: 3.00    Pack years: 0.75    Types: Cigarettes   Smokeless tobacco: Never  Substance Use Topics   Alcohol use: No   Drug use: Yes    Types: Marijuana     Allergies   Patient has no known allergies.   Review of Systems Review of Systems  Constitutional:  Negative for appetite change, chills, diaphoresis, fatigue and fever.  Respiratory:  Negative for chest tightness and shortness of breath.   Cardiovascular:  Negative for chest pain and palpitations.  Genitourinary:  Negative for flank pain, genital sores and hematuria.  Musculoskeletal:  Negative for back pain.  Skin:  Negative for rash and wound.  All other systems reviewed and are negative.   Physical Exam Triage Vital Signs ED Triage Vitals [11/01/20 1544]  Enc Vitals Group     BP 131/67     Pulse Rate 70     Resp 18     Temp 98.7 F (37.1 C)     Temp Source Oral     SpO2 99 %     Weight      Height      Head Circumference      Peak Flow  Pain Score      Pain Loc      Pain Edu?      Excl. in GC?    No data found.  Updated Vital Signs BP 131/67   Pulse 70   Temp 98.7 F (37.1 C) (Oral)   Resp 18   SpO2 99%      Physical Exam Vitals and nursing note reviewed. Exam conducted with a chaperone present.  Constitutional:      General: He is not in acute distress.    Appearance: Normal appearance. He is well-developed. He is not ill-appearing, toxic-appearing or diaphoretic.  HENT:     Head: Normocephalic and atraumatic.  Cardiovascular:     Rate and Rhythm: Normal rate and regular rhythm.  Pulmonary:     Effort: Pulmonary effort is normal. No respiratory distress.     Breath sounds: No stridor. Wheezing (diffuse, mild) present. No rhonchi or rales.  Abdominal:     General: There is no distension.      Palpations: Abdomen is soft.     Tenderness: There is no abdominal tenderness. There is no right CVA tenderness or left CVA tenderness.  Genitourinary:    Pubic Area: No rash.      Penis: Normal. No phimosis, paraphimosis, hypospadias, erythema, tenderness, discharge, swelling or lesions.      Testes: Normal.        Right: Mass, tenderness or swelling not present.        Left: Mass, tenderness or swelling not present.  Musculoskeletal:        General: Normal range of motion.     Cervical back: Normal range of motion.  Skin:    General: Skin is warm and dry.     Findings: No rash.  Neurological:     Mental Status: He is alert and oriented to person, place, and time.  Psychiatric:        Behavior: Behavior normal.     UC Treatments / Results  Labs (all labs ordered are listed, but only abnormal results are displayed) Labs Reviewed  CYTOLOGY, (ORAL, ANAL, URETHRAL) ANCILLARY ONLY    EKG   Radiology No results found.  Procedures Procedures (including critical care time)  Medications Ordered in UC Medications - No data to display  Initial Impression / Assessment and Plan / UC Course  I have reviewed the triage vital signs and the nursing notes.  Pertinent labs & imaging results that were available during my care of the patient were reviewed by me and considered in my medical decision making (see chart for details).     Assessment and Plan  Exposure to chlamydia Pt denies symptoms, normal GU exam Penile swab performed by provider with chaperone in room Discussed empiric treatment, pt prefers holding off on empiric tx, agreeable to return for treatment if needed. Discussed setting up MyChart account. Wheeze Incidental finding on exam. Pt denies chest tightness, pian, or SOB. Pt is a smoker. O2 Sat 99% with HR of 70 Denies cough or fever Encouraged to f/u with PCP for monitoring, discussed smoking cessation F/u signs/symptoms in UC or ED discussed   Final Clinical  Impressions(s) / UC Diagnoses   Final diagnoses:  Exposure to chlamydia  Wheeze     Discharge Instructions       You will be notified within 5-7 days for positive results that require treatment.  You can also view your results through your free [facility] MyChart Account.  Please follow up with primary care for recheck  of your wheezing or seek medical treatment in urgent care or emergency department if you develop difficulty breathing, chest tightness, cough, fever, or other new symptoms developing.  It is best to stop smoking in order to help prevent permanent damage to your lungs.      ED Prescriptions   None    PDMP not reviewed this encounter.   Lurene Shadow, [place] 11/01/20 [phone]

## 2020-11-01 NOTE — Discharge Instructions (Addendum)
  You will be notified within 5-7 days for positive results that require treatment.  You can also view your results through your free [facility] MyChart Account.  Please follow up with primary care for recheck of your wheezing or seek medical treatment in urgent care or emergency department if you develop difficulty breathing, chest tightness, cough, fever, or other new symptoms developing.  It is best to stop smoking in order to help prevent permanent damage to your lungs.

## 2020-11-02 LAB — CYTOLOGY, (ORAL, ANAL, URETHRAL) ANCILLARY ONLY
Chlamydia: NEGATIVE
Comment: NEGATIVE
Comment: NORMAL
Neisseria Gonorrhea: NEGATIVE

## 2020-11-13 ENCOUNTER — Encounter: Payer: Self-pay | Admitting: *Deleted

## 2021-03-09 ENCOUNTER — Other Ambulatory Visit: Payer: Self-pay

## 2021-03-09 ENCOUNTER — Encounter (HOSPITAL_COMMUNITY): Payer: Self-pay | Admitting: Physician Assistant

## 2021-03-09 ENCOUNTER — Ambulatory Visit (INDEPENDENT_AMBULATORY_CARE_PROVIDER_SITE_OTHER): Payer: No Payment, Other | Admitting: Physician Assistant

## 2021-03-09 DIAGNOSIS — F3175 Bipolar disorder, in partial remission, most recent episode depressed: Secondary | ICD-10-CM

## 2021-03-09 DIAGNOSIS — F4312 Post-traumatic stress disorder, chronic: Secondary | ICD-10-CM | POA: Diagnosis not present

## 2021-03-09 MED ORDER — HYDROXYZINE HCL 10 MG PO TABS: 10.0000 mg | ORAL_TABLET | Freq: Three times a day (TID) | ORAL | 1 refills | Status: DC | PRN

## 2021-03-09 MED ORDER — SERTRALINE HCL 50 MG PO TABS: 50.0000 mg | ORAL_TABLET | Freq: Every day | ORAL | 1 refills | Status: DC

## 2021-03-09 MED ORDER — ARIPIPRAZOLE 10 MG PO TABS: ORAL_TABLET | ORAL | 1 refills | Status: DC
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
## 2021-03-17 ENCOUNTER — Ambulatory Visit (INDEPENDENT_AMBULATORY_CARE_PROVIDER_SITE_OTHER): Payer: No Payment, Other | Admitting: Licensed Clinical Social Worker

## 2021-03-17 ENCOUNTER — Other Ambulatory Visit: Payer: Self-pay

## 2021-03-17 DIAGNOSIS — F331 Major depressive disorder, recurrent, moderate: Secondary | ICD-10-CM | POA: Diagnosis not present
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
## 2021-04-01 ENCOUNTER — Encounter (HOSPITAL_COMMUNITY): Payer: No Payment, Other | Admitting: Physician Assistant

## 2021-04-16 ENCOUNTER — Ambulatory Visit: Payer: Self-pay

## 2021-04-16 NOTE — Telephone Encounter (Signed)
[facility] agent attempting to transfer pt. In regard to dental pain. Pt. Disconnected call. Unable to reach pt. On phone number in chart.

## 2021-05-26 ENCOUNTER — Ambulatory Visit (HOSPITAL_COMMUNITY): Payer: No Payment, Other | Admitting: Licensed Clinical Social Worker

## 2021-06-16 ENCOUNTER — Ambulatory Visit (HOSPITAL_COMMUNITY): Payer: Self-pay | Admitting: Licensed Clinical Social Worker

## 2021-07-07 ENCOUNTER — Ambulatory Visit (INDEPENDENT_AMBULATORY_CARE_PROVIDER_SITE_OTHER): Payer: No Payment, Other | Admitting: Licensed Clinical Social Worker

## 2021-07-07 DIAGNOSIS — F331 Major depressive disorder, recurrent, moderate: Secondary | ICD-10-CM | POA: Diagnosis not present

## 2021-07-08 NOTE — Progress Notes (Signed)
? ?  THERAPIST PROGRESS NOTE ? ?Session Time: 42 min ? ?Participation Level: Active ? ?Behavioral Response: CasualAlertNegative, Anxious, and Depressed ? ?Type of Therapy: Individual Therapy ? ?Treatment Goals addressed: dep/anx/stressors/coping ? ?ProgressTowards Goals: Not Progressing d/t poor participation in care plan. ? ?Interventions: CBT, Supportive, and Reframing ? ?Summary: Martin Greene is a 26 y.o. male who presents with hx of dep/anx. Today pt returns for in person session. Last completed session 03/17/21. Scheduling addressed in detail. Pt reports he lost phone service and could not call. He also reports problems with transportation. LCSW discussed using the bus when his friends cannot transport him. Pt states he is too anxious on the bus yet uses the bus as needed to get [facility] to [facility] for income. LCSW discussed virtual visits if his phone is on or using his girlfriend's phone. Pt advises he does not like virtual and wants to come in person. LCSW encouraged him to consider virtual rather than no care at all. Pt verbalizes understanding and will consider. Pt off all MH meds. He is reporting feeling overwhelmed, irritable, anxious with racing thoughts, tearful, lack of focus, poor sleep. Pt is agreeable to getting back on med management providers schedule and back on meds. He has some insight his symptoms are better managed with meds. Pt continues to live with his girlfriend Administrator, sports and he wishes to have her as his alternate contact. Paperwork facilitated post session. Pt's relationships with fam remains strained.  Today LCSW informs patient of this clinician's resignation from [facility] behavioral health.  LCSW assisted patient to process thoughts and feelings related to change in care.  LCSW provided information on transition plan for new counselor.  He is encouraged to continue to stay engaged in care. [name] verbalizes understanding and agrees. He states appreciation for all  care provided. ? ?Suicidal/Homicidal: Nowithout intent/plan ? ?Therapist Response: Pt somewhat receptive to care. ? ?Plan: Return again for next avail appt with new counselor as this LCSW has resigned. ? ?Diagnosis: MDD (major depressive disorder), recurrent episode, moderate ([place]) ? ?Collaboration of Care: Other None deemed necessary this session. ? ?Patient/Guardian was advised Release of Information must be obtained prior to any record release in order to collaborate their care with an outside provider. Patient/Guardian was advised if they have not already done so to contact the registration department to sign all necessary forms in order for [place] to release information regarding their care.  ? ?Consent: Patient/Guardian gives verbal consent for treatment and assignment of benefits for services provided during this visit. Patient/Guardian expressed understanding and agreed to proceed.  ? ?Hermine Messick, LCSW ?07/08/2021 ? ?

## 2021-07-13 ENCOUNTER — Encounter (HOSPITAL_COMMUNITY): Payer: Self-pay | Admitting: Physician Assistant

## 2021-07-13 ENCOUNTER — Telehealth (INDEPENDENT_AMBULATORY_CARE_PROVIDER_SITE_OTHER): Payer: No Payment, Other | Admitting: Physician Assistant

## 2021-07-13 DIAGNOSIS — F3175 Bipolar disorder, in partial remission, most recent episode depressed: Secondary | ICD-10-CM | POA: Diagnosis not present

## 2021-07-13 DIAGNOSIS — F4312 Post-traumatic stress disorder, chronic: Secondary | ICD-10-CM

## 2021-07-13 MED ORDER — ARIPIPRAZOLE 5 MG PO TABS: 5.0000 mg | ORAL_TABLET | Freq: Every day | ORAL | 1 refills | Status: DC

## 2021-07-13 MED ORDER — HYDROXYZINE HCL 10 MG PO TABS: 10.0000 mg | ORAL_TABLET | Freq: Three times a day (TID) | ORAL | 1 refills | Status: DC | PRN

## 2021-07-13 MED ORDER — SERTRALINE HCL 50 MG PO TABS: 50.0000 mg | ORAL_TABLET | Freq: Every day | ORAL | 1 refills | Status: DC
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
## 2021-07-28 ENCOUNTER — Ambulatory Visit (HOSPITAL_COMMUNITY): Payer: No Payment, Other | Admitting: Licensed Clinical Social Worker

## 2021-08-24 ENCOUNTER — Telehealth (INDEPENDENT_AMBULATORY_CARE_PROVIDER_SITE_OTHER): Payer: No Payment, Other | Admitting: Physician Assistant

## 2021-08-24 ENCOUNTER — Encounter (HOSPITAL_COMMUNITY): Payer: Self-pay | Admitting: Physician Assistant

## 2021-08-24 DIAGNOSIS — F4312 Post-traumatic stress disorder, chronic: Secondary | ICD-10-CM | POA: Diagnosis not present

## 2021-08-24 DIAGNOSIS — F3175 Bipolar disorder, in partial remission, most recent episode depressed: Secondary | ICD-10-CM | POA: Diagnosis not present

## 2021-08-24 MED ORDER — SERTRALINE HCL 50 MG PO TABS: 50.0000 mg | ORAL_TABLET | Freq: Every day | ORAL | 2 refills | Status: DC

## 2021-08-24 MED ORDER — ARIPIPRAZOLE 5 MG PO TABS: 5.0000 mg | ORAL_TABLET | Freq: Every day | ORAL | 2 refills | Status: DC

## 2021-08-24 MED ORDER — HYDROXYZINE HCL 10 MG PO TABS: 10.0000 mg | ORAL_TABLET | Freq: Three times a day (TID) | ORAL | 2 refills | Status: DC | PRN
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
## 2021-09-20 ENCOUNTER — Ambulatory Visit (HOSPITAL_COMMUNITY): Payer: No Payment, Other | Admitting: Licensed Clinical Social Worker

## 2021-10-26 ENCOUNTER — Telehealth (HOSPITAL_COMMUNITY): Payer: No Payment, Other | Admitting: Physician Assistant

## 2021-11-11 ENCOUNTER — Encounter (HOSPITAL_COMMUNITY): Payer: Self-pay

## 2021-12-02 ENCOUNTER — Encounter (HOSPITAL_COMMUNITY): Payer: Self-pay | Admitting: Physician Assistant

## 2021-12-02 ENCOUNTER — Telehealth (INDEPENDENT_AMBULATORY_CARE_PROVIDER_SITE_OTHER): Payer: No Payment, Other | Admitting: Physician Assistant

## 2021-12-02 DIAGNOSIS — F3175 Bipolar disorder, in partial remission, most recent episode depressed: Secondary | ICD-10-CM

## 2021-12-02 DIAGNOSIS — F4312 Post-traumatic stress disorder, chronic: Secondary | ICD-10-CM | POA: Diagnosis not present

## 2021-12-02 DIAGNOSIS — F515 Nightmare disorder: Secondary | ICD-10-CM

## 2021-12-02 MED ORDER — HYDROXYZINE HCL 10 MG PO TABS: 10.0000 mg | ORAL_TABLET | Freq: Three times a day (TID) | ORAL | 1 refills | Status: DC | PRN

## 2021-12-02 MED ORDER — ARIPIPRAZOLE 10 MG PO TABS: 10.0000 mg | ORAL_TABLET | Freq: Every day | ORAL | 1 refills | Status: DC

## 2021-12-02 MED ORDER — PRAZOSIN HCL 1 MG PO CAPS: 1.0000 mg | ORAL_CAPSULE | Freq: Every day | ORAL | 1 refills | Status: DC

## 2021-12-02 MED ORDER — SERTRALINE HCL 50 MG PO TABS: 50.0000 mg | ORAL_TABLET | Freq: Every day | ORAL | 1 refills | Status: DC
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
## 2021-12-14 ENCOUNTER — Ambulatory Visit (HOSPITAL_COMMUNITY): Payer: No Payment, Other | Admitting: Mental Health

## 2021-12-15 ENCOUNTER — Telehealth (HOSPITAL_COMMUNITY): Payer: Self-pay | Admitting: Physician Assistant

## 2021-12-15 NOTE — Telephone Encounter (Signed)
Patient Martin Greene  wanting to cancel an appt did not say which appointment clld patient no ans Martin Greene Martin Greene

## 2021-12-29 ENCOUNTER — Encounter (HOSPITAL_COMMUNITY): Payer: Self-pay | Admitting: Mental Health

## 2021-12-29 ENCOUNTER — Ambulatory Visit (INDEPENDENT_AMBULATORY_CARE_PROVIDER_SITE_OTHER): Payer: No Payment, Other | Admitting: Mental Health

## 2021-12-29 DIAGNOSIS — F4312 Post-traumatic stress disorder, chronic: Secondary | ICD-10-CM

## 2021-12-29 DIAGNOSIS — F332 Major depressive disorder, recurrent severe without psychotic features: Secondary | ICD-10-CM

## 2021-12-29 NOTE — Plan of Care (Signed)
  Problem: Depression CCP Problem  1  Goal: LTG: Reduce frequency, intensity, and duration of depression symptoms as evidenced by PHQ of 5 or less Outcome: Initial Goal: STG: Kay increase management of depression AEB development of x 3 effective coping skills and reframe distorted thinking styles within the next 90 days.  Outcome: Initial Goal: STG: Martin Greene will decrease sxs of depression AEB exploration and identification of personal goals via self-report within the next 6 months  Outcome: Initial   Problem: PTSD-Trauma Disorder CCP Problem  1  Goal: LTG: Reduce frequency, intensity, and duration of PTSD symptoms so daily functioning is improved with PCL score of 20 or less Outcome: Initial Goal: STG: Martin Greene will decrease sxs of trauma AEB development of x 3 effective grounding/relaxation skills with ability to set appropriate boundaries with others as needed wthin the next 6 months  Outcome: Initial
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
## 2022-01-03 ENCOUNTER — Ambulatory Visit (HOSPITAL_COMMUNITY): Payer: No Payment, Other | Admitting: Licensed Clinical Social Worker

## 2022-01-11 ENCOUNTER — Telehealth (INDEPENDENT_AMBULATORY_CARE_PROVIDER_SITE_OTHER): Payer: No Payment, Other | Admitting: Student in an Organized Health Care Education/Training Program

## 2022-01-11 DIAGNOSIS — F515 Nightmare disorder: Secondary | ICD-10-CM | POA: Diagnosis not present

## 2022-01-11 DIAGNOSIS — F4312 Post-traumatic stress disorder, chronic: Secondary | ICD-10-CM

## 2022-01-11 DIAGNOSIS — F3175 Bipolar disorder, in partial remission, most recent episode depressed: Secondary | ICD-10-CM

## 2022-01-11 MED ORDER — SERTRALINE HCL 50 MG PO TABS: 50.0000 mg | ORAL_TABLET | Freq: Every day | ORAL | 1 refills | Status: AC

## 2022-01-11 MED ORDER — HYDROXYZINE HCL 10 MG PO TABS: 10.0000 mg | ORAL_TABLET | Freq: Three times a day (TID) | ORAL | 1 refills | Status: AC | PRN

## 2022-01-11 MED ORDER — PRAZOSIN HCL 1 MG PO CAPS: 1.0000 mg | ORAL_CAPSULE | Freq: Every day | ORAL | 1 refills | Status: AC

## 2022-01-11 MED ORDER — ARIPIPRAZOLE 10 MG PO TABS: 10.0000 mg | ORAL_TABLET | Freq: Every day | ORAL | 1 refills | Status: AC

## 2022-01-11 NOTE — Progress Notes (Signed)
Virtual Visit via Video Note  I connected with Martin Greene on 01/11/22 at  2:30 PM EDT by a video enabled telemedicine application and verified that I am speaking with the correct person using two identifiers.  Location: Patient: Home Provider: [facility]   I discussed the limitations of evaluation and Greene by telemedicine and the availability of in person appointments. The patient expressed understanding and agreed to proceed.  History of Present Illness:  Martin Greene is a 26 yr old male who presents via virtual video visit for follow\ up and medication Greene.  PPHx is significant for Bipolar 1 Disorder and Chronic PTSD and one Suicide Attempt (Cutting 2017) and Self Injurious Behavior (cutting 2017), and one hospitalization [facility] 2017).  He reports that he is doing better since his last appointment.  He reports that each day he is slowly getting a little bit better.  He reports he has been taking his medications regularly now.  He reports he has not yet started the prazosin.  He reports that this is due to cost of his medications and getting transportation to the pharmacy.  He reports that his friend will be driving him to the pharmacy this afternoon and so he will be picking it up and starting it.  Reminded him of the importance of getting out of bed slowly for the first few days after taking he reported understanding.  Discussed if he would like to increase his Zoloft further and he reports at this time since he is still improving he would like to keep the dose where he currently is.  He reports his sleep is okay.  He reports his appetite is okay.  He reports no SI, HI, or AVH.  He will return for follow-up in approximately 4 to 6 weeks.    Observations/Objective:  Psychiatric Specialty Exam: Physical Exam Constitutional:      General: He is not in acute distress.    Appearance: Normal appearance. He is normal weight. He is not ill-appearing or toxic-appearing.  HENT:     Head:  Normocephalic and atraumatic.  Pulmonary:     Effort: Pulmonary effort is normal.  Musculoskeletal:        General: Normal range of motion.  Neurological:     General: No focal deficit present.     Mental Status: He is alert.     Review of Systems  Respiratory:  Negative for cough and shortness of breath.   Cardiovascular:  Negative for chest pain.  Gastrointestinal:  Negative for abdominal pain, constipation, diarrhea, nausea and vomiting.  Neurological:  Negative for weakness and headaches.  Psychiatric/Behavioral:  Positive for dysphoric mood. Negative for hallucinations, self-injury, sleep disturbance and suicidal ideas. The patient is not nervous/anxious.     There were no vitals taken for this visit.There is no height or weight on file to calculate BMI.  General Appearance: Casual and Fairly Groomed  Eye Contact:  Good  Speech:  Clear and Coherent and Normal Rate  Volume:  Normal  Mood:  Dysphoric  Affect:  Congruent  Thought Process:  Coherent and Goal Directed  Orientation:  Full (Time, Place, and Person)  Thought Content:  WDL and Logical  Suicidal Thoughts:  No  Homicidal Thoughts:  No  Memory:  Immediate;   Good Recent;   Good  Judgement:  Fair  Insight:  Fair  Psychomotor Activity:  Normal  Concentration:  Concentration: Good and Attention Span: Good  Recall:  Good  Fund of Knowledge:  Good  Language:  Good  Akathisia:  Negative  Handed:  Right  AIMS (if indicated):     Assets:  Communication Skills Desire for Improvement Housing Resilience Social Support  ADL's:  Intact  Cognition:  WNL  Sleep:   fair     Assessment and Plan: Martin Greene is a 26 yr old male who presents via virtual video visit for follow\ up and medication Greene.  PPHx is significant for Bipolar 1 Disorder and Chronic PTSD and one Suicide Attempt (Cutting 2017) and Self Injurious Behavior (cutting 2017), and one hospitalization [facility] 2017).   Brennin is slowly but steadily  making progress with consistent taking of his medications.  He has not yet started the prazosin due to troubles affording it and getting transportation but will be going today to get it filled and will start it.  Discussed potential increases in Zoloft at this time he does not want to do that.  We will not make any medication changes at this time.  He will return for follow-up in approximately 4 to 6 weeks.   Bipolar 1 Disorder, depressed, partial remission  PTSD -Continue Zoloft 50 mg daily for depression and PTSD. 30 tablets with 1 refill. -Continue Abilify 10 mg daily.  30 tablets with 1 refill. -Continue Hydroxyzine 10 mg TID PRN for anxiety.  75 tablets with 1 refill. -Start Prazosin 1 mg QHS for Night Terrors.  30 capsules with 1 refill.    Follow Up Instructions:    I discussed the assessment and treatment plan with the patient. The patient was provided an opportunity to ask questions and all were answered. The patient agreed with the plan and demonstrated an understanding of the instructions.   The patient was advised to call back or seek an in-person evaluation if the symptoms worsen or if the condition fails to improve as anticipated.  I provided 12 minutes of non-face-to-face time during this encounter.   Lauro Franklin, MD

## 2022-01-13 ENCOUNTER — Telehealth (HOSPITAL_COMMUNITY): Payer: Medicaid Other | Admitting: Physician Assistant

## 2022-02-25 ENCOUNTER — Telehealth (HOSPITAL_COMMUNITY): Payer: No Payment, Other | Admitting: Student in an Organized Health Care Education/Training Program

## 2023-03-08 ENCOUNTER — Encounter (HOSPITAL_COMMUNITY): Payer: Self-pay

## 2023-03-08 ENCOUNTER — Ambulatory Visit (HOSPITAL_COMMUNITY)
Admission: EM | Admit: 2023-03-08 | Discharge: 2023-03-08 | Disposition: A | Discharge status: Home / Self Care | Payer: Medicaid Other | Attending: Family Medicine | Admitting: Family Medicine

## 2023-03-08 DIAGNOSIS — K529 Noninfective gastroenteritis and colitis, unspecified: Secondary | ICD-10-CM

## 2023-03-08 MED ORDER — ONDANSETRON 4 MG PO TBDP
ORAL_TABLET | ORAL | Status: AC
  Filled 2023-03-08: qty 1

## 2023-03-08 MED ORDER — ONDANSETRON 4 MG PO TBDP
4.0000 mg | ORAL_TABLET | Freq: Once | ORAL | Status: AC
  Administered 2023-03-08: 4 mg via ORAL

## 2023-03-08 MED ORDER — ONDANSETRON 4 MG PO TBDP: 4.0000 mg | ORAL_TABLET | Freq: Three times a day (TID) | ORAL | 0 refills | Status: DC | PRN

## 2023-03-08 NOTE — ED Provider Notes (Signed)
[facility]   [id number] 03/08/23 Arrival Time: 1243  ASSESSMENT & PLAN:  1. Gastroenteritis    Declines U/A. Declines IVF.  Meds ordered this encounter  Medications   ondansetron (ZOFRAN-ODT) disintegrating tablet 4 mg   ondansetron (ZOFRAN-ODT) 4 MG disintegrating tablet    Sig: Take 1 tablet (4 mg total) by mouth every 8 (eight) hours as needed for nausea or vomiting.    Dispense:  15 tablet    Refill:  0   Work note provided.  Discussed typical duration of symptoms for suspected viral GI illness. Will do his best to ensure adequate fluid intake in order to avoid dehydration. Will proceed to the Emergency Department for evaluation if unable to tolerate PO fluids regularly.  Reviewed expectations re: course of current medical issues. Questions answered. Outlined signs and symptoms indicating need for more acute intervention. Patient verbalized understanding. After Visit Summary given.   SUBJECTIVE: History from: patient.  Martin Greene is a 27 y.o. male who presents with complaint of non-bilious, non-bloody intermittent n/v with non-bloody diarrhea. Onset  approx 48-72 hours ago . Abdominal discomfort: mild and cramping. Symptoms are gradually improving since beginning. Aggravating factors: eating. Alleviating factors: none identified. Associated symptoms: fatigue. He denies fever. Appetite: decreased. PO intake: decreased. Ambulatory without assistance. Urinary symptoms: none.   OBJECTIVE:  Vitals:   03/08/23 1315  BP: 116/79  Pulse: 95  Resp: 18  Temp: 97.8 F (36.6 C)  TempSrc: Oral  SpO2: 95%    General appearance: alert; no distress Oropharynx: dry Lungs: clear to auscultation bilaterally; unlabored Heart: regular Abdomen: soft Extremities: no edema; symmetrical with no gross deformities Skin: warm; dry Neurologic: normal gait Psychological: alert and cooperative; normal mood and affect  Labs:  Labs Reviewed    No Known Allergies                                              History reviewed. No pertinent past medical history. Social History   Socioeconomic History   Marital status: Single    Spouse name: Not on file   Number of children: Not on file   Years of education: Not on file   Highest education level: Not on file  Occupational History   Not on file  Tobacco Use   Smoking status: Some Days    Current packs/day: 0.25    Average packs/day: 0.3 packs/day for 3.0 years (0.8 ttl pk-yrs)    Types: Cigarettes   Smokeless tobacco: Never  Substance and Sexual Activity   Alcohol use: No   Drug use: Yes    Types: Marijuana   Sexual activity: Never  Other Topics Concern   Not on file  Social History Narrative   Not on file   Social Determinants of Health   Financial Resource Strain: High Risk (12/29/2021)   Overall Financial Resource Strain (CARDIA)    Difficulty of Paying Living Expenses: Very hard  Food Insecurity: Food Insecurity Present (12/29/2021)   Hunger Vital Sign    Worried About Running Out of Food in the Last Year: Often true    Ran Out of Food in the Last Year: Often true  Transportation Needs: Unmet Transportation Needs (12/29/2021)   PRAPARE - Transportation    Lack of Transportation (Medical): Yes    Lack of Transportation (Non-Medical): Yes  Physical Activity: Insufficiently Active (12/29/2021)   Exercise Vital Sign  Days of Exercise per Week: 2 days    Minutes of Exercise per Session: 60 min  Stress: Stress Concern Present (12/29/2021)   [employer] of Occupational Health - Occupational Stress Questionnaire    Feeling of Stress : Very much  Social Connections: Moderately Isolated (12/29/2021)   Social Connection and Isolation Panel [NHANES]    Frequency of Communication with Friends and Family: More than three times a week    Frequency of Social Gatherings with Friends and Family: Twice a week    Attends Religious Services: Never    Database administrator or Organizations: No     Attends Banker Meetings: Never    Marital Status: Living with partner  Intimate Partner Violence: Not At Risk (12/29/2021)   Humiliation, Afraid, Rape, and Kick questionnaire    Fear of Current or Ex-Partner: No    Emotionally Abused: No    Physically Abused: No    Sexually Abused: No   History reviewed. No pertinent family history.    Mardella Layman, MD 03/08/23 1359

## 2023-03-08 NOTE — Discharge Instructions (Signed)
Please do your best to ensure adequate fluid intake in order to avoid dehydration. If you find that you are unable to tolerate drinking fluids regularly please proceed to the Emergency Department for evaluation. ° ° °

## 2023-03-08 NOTE — ED Triage Notes (Signed)
Pt presents to the office for nausea and vomiting x 3 days. Pt thinks he has a stomach bug. Denies any recent fever.

## 2023-10-12 ENCOUNTER — Ambulatory Visit (HOSPITAL_COMMUNITY): Admission: EM | Admit: 2023-10-12 | Discharge: 2023-10-12 | Disposition: A | Discharge status: Home / Self Care

## 2023-10-12 ENCOUNTER — Encounter (HOSPITAL_COMMUNITY): Payer: Self-pay

## 2023-10-12 DIAGNOSIS — R1084 Generalized abdominal pain: Secondary | ICD-10-CM

## 2023-10-12 DIAGNOSIS — R197 Diarrhea, unspecified: Secondary | ICD-10-CM | POA: Diagnosis not present

## 2023-10-12 NOTE — Discharge Instructions (Addendum)
 Diarrhea and abdominal pain: Patient feels this is acute and relates to food that he ate last night.  If that is the case he just needs to give it time and work on hydration.  Provided information on foods to eat to avoid diarrhea and liquids for rehydration.  If he has persistent diarrhea for several days, he could try loperamide/Imodium A-D, 2 mg, 1 or 2 pills once a day to try to slow down the diarrhea.  Be cautious about loperamide use as it could cause severe constipation.  If diarrhea persists more than 3 to 4 days, return and may need stool specimens for stool culture or C. difficile testing.  If urine becomes dark and concentrated, patient has a severe dry mouth, becomes very fatigued and is not urinating at least every 2 hours, the symptoms could indicate dehydration and he might need to go to an emergency room for IV fluids.

## 2023-10-12 NOTE — ED Triage Notes (Signed)
 Patient here today with c/o diarrhea and upset stomach that started this morning. Patient thinks that it came from something he ate last night.

## 2023-10-12 NOTE — ED Provider Notes (Signed)
 [facility]    CSN: [id number] Arrival date & time: 10/12/23  1044      History   Chief Complaint Chief Complaint  Patient presents with   Diarrhea    HPI Martin Greene is a 28 y.o. male.   Patient reports he ate some leftover spaghetti last night (10/11/23).  Spaghetti was 60 or 45 days old.  He thought it was safe to eat but hour or 2 later his stomach was upset.  He got up at least twice during the night with bowel movements/loose stools.  This morning he is having abdominal pain and liquid stool.  He has no appetite but denies fever, nausea, vomiting  and constipation.   Diarrhea Associated symptoms: abdominal pain   Associated symptoms: no arthralgias, no fever and no vomiting     History reviewed. No pertinent past medical history.  Patient Active Problem List   Diagnosis Date Noted   Nightmares associated with chronic post-traumatic stress disorder 12/02/2021   Bipolar 1 disorder, depressed, partial remission (HCC) 01/07/2020   Chronic post-traumatic stress disorder (PTSD) 01/07/2020   Cannabis use disorder, moderate, dependence (HCC) 01/07/2020   Severe single current episode of major depressive disorder, without psychotic features (HCC)    MDD (major depressive disorder) 11/16/2015    History reviewed. No pertinent surgical history.     Home Medications    Prior to Admission medications   Medication Sig Start Date End Date Taking? Authorizing Provider  ARIPiprazole  (ABILIFY ) 10 MG tablet Take 1 tablet (10 mg total) by mouth daily. 01/11/22   Pashayan, Marsa RAMAN, DO  hydrOXYzine  (ATARAX ) 10 MG tablet Take 1 tablet (10 mg total) by mouth 3 (three) times daily as needed. 01/11/22   Pashayan, Marsa RAMAN, DO  prazosin  (MINIPRESS ) 1 MG capsule Take 1 capsule (1 mg total) by mouth at bedtime. 01/11/22   Pashayan, Marsa RAMAN, DO  sertraline  (ZOLOFT ) 50 MG tablet Take 1 tablet (50 mg total) by mouth daily. TAKE 1 DAILY AFTER BREAKFAST 01/11/22   Raliegh Marsa RAMAN, DO    Family History History reviewed. No pertinent family history.  Social History Social History   Tobacco Use   Smoking status: Former    Current packs/day: 0.25    Average packs/day: 0.3 packs/day for 3.0 years (0.8 ttl pk-yrs)    Types: Cigarettes   Smokeless tobacco: Never  Vaping Use   Vaping status: Every Day  Substance Use Topics   Alcohol use: No   Drug use: Yes    Types: Marijuana     Allergies   Patient has no known allergies.   Review of Systems Review of Systems  Constitutional:  Negative for fever.  Respiratory:  Negative for cough.   Cardiovascular:  Negative for chest pain.  Gastrointestinal:  Positive for abdominal pain and diarrhea. Negative for constipation, nausea and vomiting.  Musculoskeletal:  Negative for arthralgias and back pain.  Skin:  Negative for color change and rash.  Neurological:  Negative for syncope.  All other systems reviewed and are negative.    Physical Exam Triage Vital Signs ED Triage Vitals [10/12/23 1110]  Encounter Vitals Group     BP 106/68     Girls Systolic BP Percentile      Girls Diastolic BP Percentile      Boys Systolic BP Percentile      Boys Diastolic BP Percentile      Pulse Rate (!) 59     Resp 16     Temp 97.9 F (  36.6 C)     Temp Source Oral     SpO2 96 %     Weight      Height      Head Circumference      Peak Flow      Pain Score 5     Pain Loc      Pain Education      Exclude from Growth Chart    No data found.  Updated Vital Signs BP 106/68 (BP Location: Right Arm)   Pulse (!) 59   Temp 97.9 F (36.6 C) (Oral)   Resp 16   SpO2 96%   Visual Acuity Right Eye Distance:   Left Eye Distance:   Bilateral Distance:    Right Eye Near:   Left Eye Near:    Bilateral Near:     Physical Exam Vitals and nursing note reviewed.  Constitutional:      General: He is not in acute distress.    Appearance: He is well-developed. He is not ill-appearing or toxic-appearing.   HENT:     Head: Normocephalic and atraumatic.     Right Ear: Hearing, tympanic membrane, ear canal and external ear normal.     Left Ear: Hearing, tympanic membrane, ear canal and external ear normal.     Nose: No congestion or rhinorrhea.     Right Sinus: No maxillary sinus tenderness or frontal sinus tenderness.     Left Sinus: No maxillary sinus tenderness or frontal sinus tenderness.     Mouth/Throat:     Lips: Pink.     Mouth: Mucous membranes are moist.     Pharynx: Uvula midline. No oropharyngeal exudate or posterior oropharyngeal erythema.     Tonsils: No tonsillar exudate.  Eyes:     Conjunctiva/sclera: Conjunctivae normal.     Pupils: Pupils are equal, round, and reactive to light.  Cardiovascular:     Rate and Rhythm: Normal rate and regular rhythm.     Heart sounds: S1 normal and S2 normal. No murmur heard. Pulmonary:     Effort: Pulmonary effort is normal. No respiratory distress.     Breath sounds: Normal breath sounds. No decreased breath sounds, wheezing, rhonchi or rales.  Abdominal:     General: Bowel sounds are normal.     Palpations: Abdomen is soft.     Tenderness: There is generalized abdominal tenderness (Mild).  Musculoskeletal:        General: No swelling.     Cervical back: Neck supple.  Lymphadenopathy:     Head:     Right side of head: No submental, submandibular, tonsillar, preauricular or posterior auricular adenopathy.     Left side of head: No submental, submandibular, tonsillar, preauricular or posterior auricular adenopathy.     Cervical: No cervical adenopathy.     Right cervical: No superficial cervical adenopathy.    Left cervical: No superficial cervical adenopathy.  Skin:    General: Skin is warm and dry.     Capillary Refill: Capillary refill takes less than 2 seconds.     Findings: No rash.  Neurological:     Mental Status: He is alert and oriented to person, place, and time.  Psychiatric:        Mood and Affect: Mood normal.       UC Treatments / Results  Labs (all labs ordered are listed, but only abnormal results are displayed) Labs Reviewed - No data to display  EKG   Radiology No results found.  Procedures Procedures (  including critical care time)  Medications Ordered in UC Medications - No data to display  Initial Impression / Assessment and Plan / UC Course  I have reviewed the triage vital signs and the nursing notes.  Pertinent labs & imaging results that were available during my care of the patient were reviewed by me and considered in my medical decision making (see chart for details).  Plan of Care: Diarrhea and abdominal pain: See discharge instructions for specifics.  Does not need prescription medication at this time.  Needs hydration.  If symptoms persist, will need stool specimens to evaluate for infection or C. difficile.  Work excuse provided.  Follow-up if symptoms do not improve, worsen or new symptoms occur.  I reviewed the plan of care with the patient and/or the patient's guardian.  The patient and/or guardian had time to ask questions and acknowledged that the questions were answered.  I provided instruction on symptoms or reasons to return here or to go to an ER, if symptoms/condition did not improve, worsened or if new symptoms occurred.  Final Clinical Impressions(s) / UC Diagnoses   Final diagnoses:  Diarrhea, unspecified type  Generalized abdominal pain     Discharge Instructions      Diarrhea and abdominal pain: Patient feels this is acute and relates to food that he ate last night.  If that is the case he just needs to give it time and work on hydration.  Provided information on foods to eat to avoid diarrhea and liquids for rehydration.  If he has persistent diarrhea for several days, he could try loperamide/Imodium A-D, 2 mg, 1 or 2 pills once a day to try to slow down the diarrhea.  Be cautious about loperamide use as it could cause severe constipation.  If  diarrhea persists more than 3 to 4 days, return and may need stool specimens for stool culture or C. difficile testing.  If urine becomes dark and concentrated, patient has a severe dry mouth, becomes very fatigued and is not urinating at least every 2 hours, the symptoms could indicate dehydration and he might need to go to an emergency room for IV fluids.     ED Prescriptions   None    PDMP not reviewed this encounter.   Ival Domino, FNP 10/12/23 [phone]

## 2023-10-21 ENCOUNTER — Encounter (HOSPITAL_COMMUNITY): Payer: Self-pay

## 2023-10-21 ENCOUNTER — Ambulatory Visit (HOSPITAL_COMMUNITY)
Admission: EM | Admit: 2023-10-21 | Discharge: 2023-10-21 | Disposition: A | Discharge status: Home / Self Care | Attending: Internal Medicine | Admitting: Internal Medicine

## 2023-10-21 DIAGNOSIS — K529 Noninfective gastroenteritis and colitis, unspecified: Secondary | ICD-10-CM

## 2023-10-21 DIAGNOSIS — R197 Diarrhea, unspecified: Secondary | ICD-10-CM

## 2023-10-21 HISTORY — DX: Insomnia, unspecified: G47.00

## 2023-10-21 HISTORY — DX: Anxiety disorder, unspecified: F41.9

## 2023-10-21 HISTORY — DX: Nausea: R11.0

## 2023-10-21 MED ORDER — ONDANSETRON 4 MG PO TBDP: 4.0000 mg | ORAL_TABLET | Freq: Three times a day (TID) | ORAL | 0 refills | Status: AC | PRN

## 2023-10-21 MED ORDER — ONDANSETRON 4 MG PO TBDP
ORAL_TABLET | ORAL | Status: AC
  Filled 2023-10-21: qty 1

## 2023-10-21 MED ORDER — ONDANSETRON 4 MG PO TBDP
4.0000 mg | ORAL_TABLET | Freq: Once | ORAL | Status: AC
  Administered 2023-10-21: 4 mg via ORAL

## 2023-10-21 NOTE — ED Triage Notes (Signed)
 Patient reports that he had watery stools that started yesterday and intermittent abdominal  cramping prior to diarrheal episodes.  Patient states he was seen for the same on 10/12/23 and states he had normal stool 3 days prior to yesterday.   Patient states he has been taking ibuprofen  and Tylenol  for the abdominal cramping.

## 2023-10-21 NOTE — ED Provider Notes (Signed)
 [facility]    CSN: [id number] Arrival date & time: 10/21/23  1012      History   Chief Complaint Chief Complaint  Patient presents with   Diarrhea   Abdominal Pain    HPI Martin Greene is a 28 y.o. male.   28 year old male who presents urgent care with complaints of diarrhea and abdominal cramping.  He reports this started yesterday.  He reports that the diarrhea can be severe at times with complete watery.  It is not foul-smelling.  He is having some nausea but no vomiting.  He is able to stay hydrated.  He did have a tooth removed recently and has been eating mostly soft and liquids.  He denies taking any antibiotics.  He was seen for the same symptoms on July 3.  This was after eating week-old spaghetti that may have caused his symptoms.  He had symptoms for several days and then had about 3 days of normal bowel movements before the symptoms started again.  He has had issues in the past with this as well and is wondering if he may have some an irritable bowel.  He has not seen a gastroenterologist for his symptoms.  He denies fevers, vomiting, chest pain, shortness of breath.     Diarrhea Associated symptoms: abdominal pain   Associated symptoms: no arthralgias, no chills, no fever and no vomiting   Abdominal Pain Associated symptoms: diarrhea and nausea   Associated symptoms: no chest pain, no chills, no cough, no dysuria, no fever, no hematuria, no shortness of breath, no sore throat and no vomiting     Past Medical History:  Diagnosis Date   Anxiety    Insomnia    Nausea     Patient Active Problem List   Diagnosis Date Noted   Nightmares associated with chronic post-traumatic stress disorder 12/02/2021   Bipolar 1 disorder, depressed, partial remission (HCC) 01/07/2020   Chronic post-traumatic stress disorder (PTSD) 01/07/2020   Cannabis use disorder, moderate, dependence (HCC) 01/07/2020   Severe single current episode of major depressive disorder,  without psychotic features (HCC)    MDD (major depressive disorder) 11/16/2015    History reviewed. No pertinent surgical history.     Home Medications    Prior to Admission medications   Medication Sig Start Date End Date Taking? Authorizing Provider  ondansetron  (ZOFRAN -ODT) 4 MG disintegrating tablet Take 1 tablet (4 mg total) by mouth every 8 (eight) hours as needed for nausea or vomiting. 10/21/23  Yes Nikko Quast A, PA-C  ARIPiprazole  (ABILIFY ) 10 MG tablet Take 1 tablet (10 mg total) by mouth daily. 01/11/22   Pashayan, Marsa RAMAN, DO  hydrOXYzine  (ATARAX ) 10 MG tablet Take 1 tablet (10 mg total) by mouth 3 (three) times daily as needed. 01/11/22   Pashayan, Marsa RAMAN, DO  prazosin  (MINIPRESS ) 1 MG capsule Take 1 capsule (1 mg total) by mouth at bedtime. 01/11/22   Pashayan, Marsa RAMAN, DO  sertraline  (ZOLOFT ) 50 MG tablet Take 1 tablet (50 mg total) by mouth daily. TAKE 1 DAILY AFTER BREAKFAST 01/11/22   Raliegh Marsa RAMAN, DO    Family History History reviewed. No pertinent family history.  Social History Social History   Tobacco Use   Smoking status: Former    Current packs/day: 0.25    Average packs/day: 0.3 packs/day for 3.0 years (0.8 ttl pk-yrs)    Types: Cigarettes   Smokeless tobacco: Never  Vaping Use   Vaping status: Every Day   Substances: Nicotine   Substance Use Topics   Alcohol use: No   Drug use: Yes    Types: Marijuana     Allergies   Patient has no known allergies.   Review of Systems Review of Systems  Constitutional:  Negative for chills and fever.  HENT:  Negative for ear pain and sore throat.   Eyes:  Negative for pain and visual disturbance.  Respiratory:  Negative for cough and shortness of breath.   Cardiovascular:  Negative for chest pain and palpitations.  Gastrointestinal:  Positive for abdominal pain, diarrhea and nausea. Negative for vomiting.  Genitourinary:  Negative for dysuria and hematuria.  Musculoskeletal:   Negative for arthralgias and back pain.  Skin:  Negative for color change and rash.  Neurological:  Negative for seizures and syncope.  All other systems reviewed and are negative.    Physical Exam Triage Vital Signs ED Triage Vitals  Encounter Vitals Group     BP 10/21/23 1102 120/74     Girls Systolic BP Percentile --      Girls Diastolic BP Percentile --      Boys Systolic BP Percentile --      Boys Diastolic BP Percentile --      Pulse Rate 10/21/23 1102 (!) 52     Resp 10/21/23 1102 14     Temp 10/21/23 1102 97.6 F (36.4 C)     Temp Source 10/21/23 1102 Oral     SpO2 10/21/23 1102 96 %     Weight --      Height --      Head Circumference --      Peak Flow --      Pain Score 10/21/23 1104 3     Pain Loc --      Pain Education --      Exclude from Growth Chart --    No data found.  Updated Vital Signs BP 120/74 (BP Location: Right Arm)   Pulse (!) 52   Temp 97.6 F (36.4 C) (Oral)   Resp 14   SpO2 96%   Visual Acuity Right Eye Distance:   Left Eye Distance:   Bilateral Distance:    Right Eye Near:   Left Eye Near:    Bilateral Near:     Physical Exam Vitals and nursing note reviewed.  Constitutional:      General: He is not in acute distress.    Appearance: He is well-developed.  HENT:     Head: Normocephalic and atraumatic.  Eyes:     Conjunctiva/sclera: Conjunctivae normal.  Cardiovascular:     Rate and Rhythm: Normal rate and regular rhythm.     Heart sounds: No murmur heard. Pulmonary:     Effort: Pulmonary effort is normal. No respiratory distress.     Breath sounds: Normal breath sounds.  Abdominal:     General: Bowel sounds are normal. There is no distension or abdominal bruit.     Palpations: Abdomen is soft.     Tenderness: There is generalized abdominal tenderness. There is no guarding or rebound. Negative signs include Murphy's sign and McBurney's sign.     Hernia: No hernia is present.  Musculoskeletal:        General: No  swelling.     Cervical back: Neck supple.  Skin:    General: Skin is warm and dry.     Capillary Refill: Capillary refill takes less than 2 seconds.  Neurological:     General: No focal deficit present.  Mental Status: He is alert.  Psychiatric:        Mood and Affect: Mood normal.      UC Treatments / Results  Labs (all labs ordered are listed, but only abnormal results are displayed) Labs Reviewed  GASTROINTESTINAL PANEL BY PCR, STOOL (REPLACES STOOL CULTURE)    EKG   Radiology No results found.  Procedures Procedures (including critical care time)  Medications Ordered in UC Medications  ondansetron  (ZOFRAN -ODT) disintegrating tablet 4 mg (4 mg Oral Given 10/21/23 1158)    Initial Impression / Assessment and Plan / UC Course  I have reviewed the triage vital signs and the nursing notes.  Pertinent labs & imaging results that were available during my care of the patient were reviewed by me and considered in my medical decision making (see chart for details).     Gastroenteritis  Diarrhea of presumed infectious origin   Symptoms and physical exam findings are most consistent with persistent gastroenteritis.  As the symptoms have been ongoing for over a week now, we will get a stool sample.  For the nausea we will prescribe Zofran .  Your vital signs and physical exam findings are reassuring.  As you are able to stay hydrated, we will continue to monitor this as an outpatient.  Continue to drink plenty of fluids even if you do not feel like taking in solid food.  Please try Imodium AD over-the-counter to help with diarrhea.  If abdominal pain worsens, you develop fevers or you are unable to stay hydrated then recommend going to the emergency room for further evaluation.  May want to consider scheduling appointment to see a gastroenterologist if you are having recurrent issues with your bowels. Zofran  4 mg orally disintegrating tablet every 8 hours as needed for  nausea. Imodium AD over the counter to help with diarrhea. Stay hydrated.  If abdominal pain worsens, you develop fevers or you are unable to stay hydrated then recommend going to the emergency room for further evaluation May want to consider scheduling appointment to see a gastroenterologist if you are having recurrent issues with your bowels  Final Clinical Impressions(s) / UC Diagnoses   Final diagnoses:  Gastroenteritis  Diarrhea of presumed infectious origin     Discharge Instructions      Symptoms and physical exam findings are most consistent with persistent gastroenteritis.  As the symptoms have been ongoing for over a week now, we will get a stool sample.  For the nausea we will prescribe Zofran .  Your vital signs and physical exam findings are reassuring.  As you are able to stay hydrated, we will continue to monitor this as an outpatient.  Continue to drink plenty of fluids even if you do not feel like taking in solid food.  Please try Imodium AD over-the-counter to help with diarrhea.  If abdominal pain worsens, you develop fevers or you are unable to stay hydrated then recommend going to the emergency room for further evaluation.  May want to consider scheduling appointment to see a gastroenterologist if you are having recurrent issues with your bowels. Zofran  4 mg orally disintegrating tablet every 8 hours as needed for nausea. Imodium AD over the counter to help with diarrhea. Stay hydrated.  If abdominal pain worsens, you develop fevers or you are unable to stay hydrated then recommend going to the emergency room for further evaluation May want to consider scheduling appointment to see a gastroenterologist if you are having recurrent issues with your bowels  ED Prescriptions     Medication Sig Dispense Auth. Provider   ondansetron  (ZOFRAN -ODT) 4 MG disintegrating tablet Take 1 tablet (4 mg total) by mouth every 8 (eight) hours as needed for nausea or vomiting. 20  tablet Teresa Almarie LABOR, [place]      PDMP not reviewed this encounter.   Teresa Almarie LABOR, PA-C 10/21/23 1257

## 2023-10-21 NOTE — Discharge Instructions (Addendum)
 Symptoms and physical exam findings are most consistent with persistent gastroenteritis.  As the symptoms have been ongoing for over a week now, we will get a stool sample.  For the nausea we will prescribe Zofran .  Your vital signs and physical exam findings are reassuring.  As you are able to stay hydrated, we will continue to monitor this as an outpatient.  Continue to drink plenty of fluids even if you do not feel like taking in solid food.  Please try Imodium AD over-the-counter to help with diarrhea.  If abdominal pain worsens, you develop fevers or you are unable to stay hydrated then recommend going to the emergency room for further evaluation.  May want to consider scheduling appointment to see a gastroenterologist if you are having recurrent issues with your bowels. Zofran  4 mg orally disintegrating tablet every 8 hours as needed for nausea. Imodium AD over the counter to help with diarrhea. Stay hydrated.  If abdominal pain worsens, you develop fevers or you are unable to stay hydrated then recommend going to the emergency room for further evaluation May want to consider scheduling appointment to see a gastroenterologist if you are having recurrent issues with your bowels
